# Patient Record
Sex: Female | Born: 1955 | Race: White | Hispanic: No | Marital: Married | Smoking: Never smoker
Health system: Southern US, Community
[De-identification: ages and names within clinical notes are randomized; demographics above are authoritative.]

## PROBLEM LIST (undated history)

## (undated) DIAGNOSIS — Z923 Personal history of irradiation: Secondary | ICD-10-CM

## (undated) DIAGNOSIS — C50919 Malignant neoplasm of unspecified site of unspecified female breast: Secondary | ICD-10-CM

## (undated) DIAGNOSIS — I10 Essential (primary) hypertension: Secondary | ICD-10-CM

## (undated) HISTORY — DX: Malignant neoplasm of unspecified site of unspecified female breast: C50.919

## (undated) HISTORY — DX: Essential (primary) hypertension: I10

---

## 1997-12-15 ENCOUNTER — Other Ambulatory Visit: Admission: RE | Admit: 1997-12-15 | Discharge: 1997-12-15 | Payer: Self-pay | Admitting: Gynecology

## 1998-01-06 ENCOUNTER — Other Ambulatory Visit: Admission: RE | Admit: 1998-01-06 | Discharge: 1998-01-06 | Payer: Self-pay | Admitting: Gynecology

## 1998-12-14 ENCOUNTER — Other Ambulatory Visit: Admission: RE | Admit: 1998-12-14 | Discharge: 1998-12-14 | Payer: Self-pay | Admitting: Gynecology

## 2000-01-18 ENCOUNTER — Other Ambulatory Visit: Admission: RE | Admit: 2000-01-18 | Discharge: 2000-01-18 | Payer: Self-pay | Admitting: Gynecology

## 2001-04-23 ENCOUNTER — Other Ambulatory Visit: Admission: RE | Admit: 2001-04-23 | Discharge: 2001-04-23 | Payer: Self-pay | Admitting: Gynecology

## 2002-05-31 ENCOUNTER — Encounter: Admission: RE | Admit: 2002-05-31 | Discharge: 2002-05-31 | Payer: Self-pay | Admitting: Gynecology

## 2002-05-31 ENCOUNTER — Encounter: Payer: Self-pay | Admitting: Gynecology

## 2002-08-05 ENCOUNTER — Other Ambulatory Visit: Admission: RE | Admit: 2002-08-05 | Discharge: 2002-08-05 | Payer: Self-pay | Admitting: Gynecology

## 2003-08-18 ENCOUNTER — Other Ambulatory Visit: Admission: RE | Admit: 2003-08-18 | Discharge: 2003-08-18 | Payer: Self-pay | Admitting: Gynecology

## 2003-09-01 ENCOUNTER — Encounter: Admission: RE | Admit: 2003-09-01 | Discharge: 2003-09-01 | Payer: Self-pay | Admitting: Gynecology

## 2004-01-05 ENCOUNTER — Ambulatory Visit (HOSPITAL_COMMUNITY): Admission: RE | Admit: 2004-01-05 | Discharge: 2004-01-05 | Payer: Self-pay | Admitting: Family Medicine

## 2004-08-30 ENCOUNTER — Other Ambulatory Visit: Admission: RE | Admit: 2004-08-30 | Discharge: 2004-08-30 | Payer: Self-pay | Admitting: Gynecology

## 2004-09-27 ENCOUNTER — Encounter: Admission: RE | Admit: 2004-09-27 | Discharge: 2004-09-27 | Payer: Self-pay | Admitting: Gynecology

## 2005-09-28 ENCOUNTER — Encounter: Admission: RE | Admit: 2005-09-28 | Discharge: 2005-09-28 | Payer: Self-pay | Admitting: Gynecology

## 2005-10-03 ENCOUNTER — Other Ambulatory Visit: Admission: RE | Admit: 2005-10-03 | Discharge: 2005-10-03 | Payer: Self-pay | Admitting: Gynecology

## 2006-12-07 ENCOUNTER — Encounter: Admission: RE | Admit: 2006-12-07 | Discharge: 2006-12-07 | Payer: Self-pay | Admitting: Gynecology

## 2006-12-12 ENCOUNTER — Other Ambulatory Visit: Admission: RE | Admit: 2006-12-12 | Discharge: 2006-12-12 | Payer: Self-pay | Admitting: Gynecology

## 2008-01-08 ENCOUNTER — Encounter: Admission: RE | Admit: 2008-01-08 | Discharge: 2008-01-08 | Payer: Self-pay | Admitting: Gynecology

## 2009-01-08 ENCOUNTER — Encounter: Admission: RE | Admit: 2009-01-08 | Discharge: 2009-01-08 | Payer: Self-pay | Admitting: Gynecology

## 2010-04-19 ENCOUNTER — Encounter: Admission: RE | Admit: 2010-04-19 | Discharge: 2010-04-19 | Payer: Self-pay | Admitting: Gynecology

## 2010-04-26 ENCOUNTER — Encounter: Admission: RE | Admit: 2010-04-26 | Discharge: 2010-04-26 | Payer: Self-pay | Admitting: Gynecology

## 2010-04-29 ENCOUNTER — Encounter: Admission: RE | Admit: 2010-04-29 | Discharge: 2010-04-29 | Payer: Self-pay | Admitting: Gynecology

## 2010-05-04 ENCOUNTER — Encounter: Admission: RE | Admit: 2010-05-04 | Discharge: 2010-05-04 | Payer: Self-pay | Admitting: Gynecology

## 2010-05-26 ENCOUNTER — Encounter
Admission: RE | Admit: 2010-05-26 | Discharge: 2010-05-26 | Payer: Self-pay | Source: Home / Self Care | Admitting: Surgery

## 2010-05-31 ENCOUNTER — Encounter
Admission: RE | Admit: 2010-05-31 | Discharge: 2010-05-31 | Payer: Self-pay | Source: Home / Self Care | Attending: Surgery | Admitting: Surgery

## 2010-05-31 ENCOUNTER — Ambulatory Visit
Admission: RE | Admit: 2010-05-31 | Discharge: 2010-05-31 | Payer: Self-pay | Source: Home / Self Care | Attending: Surgery | Admitting: Surgery

## 2010-05-31 HISTORY — PX: BREAST LUMPECTOMY: SHX2

## 2010-05-31 HISTORY — PX: BREAST EXCISIONAL BIOPSY: SUR124

## 2010-06-02 ENCOUNTER — Ambulatory Visit: Payer: Self-pay | Admitting: Oncology

## 2010-06-22 ENCOUNTER — Ambulatory Visit
Admission: RE | Admit: 2010-06-22 | Discharge: 2010-07-20 | Payer: Self-pay | Source: Home / Self Care | Attending: Radiation Oncology | Admitting: Radiation Oncology

## 2010-06-27 LAB — PREGNANCY, URINE: Preg Test, Ur: NEGATIVE

## 2010-07-21 ENCOUNTER — Ambulatory Visit: Payer: BC Managed Care – PPO | Attending: Radiation Oncology | Admitting: Radiation Oncology

## 2010-07-21 DIAGNOSIS — Z510 Encounter for antineoplastic radiation therapy: Secondary | ICD-10-CM | POA: Insufficient documentation

## 2010-07-21 DIAGNOSIS — D0590 Unspecified type of carcinoma in situ of unspecified breast: Secondary | ICD-10-CM | POA: Insufficient documentation

## 2010-07-21 DIAGNOSIS — C50119 Malignant neoplasm of central portion of unspecified female breast: Secondary | ICD-10-CM | POA: Insufficient documentation

## 2010-07-21 DIAGNOSIS — L538 Other specified erythematous conditions: Secondary | ICD-10-CM | POA: Insufficient documentation

## 2010-08-09 ENCOUNTER — Encounter (HOSPITAL_BASED_OUTPATIENT_CLINIC_OR_DEPARTMENT_OTHER): Payer: BC Managed Care – PPO | Admitting: Oncology

## 2010-08-09 ENCOUNTER — Other Ambulatory Visit: Payer: Self-pay | Admitting: Oncology

## 2010-08-09 DIAGNOSIS — D0590 Unspecified type of carcinoma in situ of unspecified breast: Secondary | ICD-10-CM

## 2010-08-09 LAB — COMPREHENSIVE METABOLIC PANEL
ALT: 14 U/L (ref 0–35)
AST: 18 U/L (ref 0–37)
Alkaline Phosphatase: 65 U/L (ref 39–117)
Calcium: 9 mg/dL (ref 8.4–10.5)
Chloride: 104 mEq/L (ref 96–112)
Creatinine, Ser: 0.9 mg/dL (ref 0.40–1.20)
Potassium: 3.6 mEq/L (ref 3.5–5.3)

## 2010-08-09 LAB — CBC WITH DIFFERENTIAL/PLATELET
BASO%: 0.2 % (ref 0.0–2.0)
EOS%: 0.7 % (ref 0.0–7.0)
MCH: 31.8 pg (ref 25.1–34.0)
MCHC: 34.5 g/dL (ref 31.5–36.0)
MONO%: 9.3 % (ref 0.0–14.0)
NEUT%: 80.7 % — ABNORMAL HIGH (ref 38.4–76.8)
RDW: 12.9 % (ref 11.2–14.5)
lymph#: 0.8 10*3/uL — ABNORMAL LOW (ref 0.9–3.3)

## 2010-08-18 ENCOUNTER — Encounter (HOSPITAL_BASED_OUTPATIENT_CLINIC_OR_DEPARTMENT_OTHER): Payer: BC Managed Care – PPO | Admitting: Oncology

## 2010-08-18 DIAGNOSIS — D0590 Unspecified type of carcinoma in situ of unspecified breast: Secondary | ICD-10-CM

## 2010-08-18 DIAGNOSIS — C50119 Malignant neoplasm of central portion of unspecified female breast: Secondary | ICD-10-CM

## 2010-08-31 LAB — DIFFERENTIAL
Basophils Relative: 0 % (ref 0–1)
Eosinophils Absolute: 0.1 10*3/uL (ref 0.0–0.7)
Eosinophils Relative: 1 % (ref 0–5)
Lymphs Abs: 2.3 10*3/uL (ref 0.7–4.0)
Neutrophils Relative %: 64 % (ref 43–77)

## 2010-08-31 LAB — COMPREHENSIVE METABOLIC PANEL
ALT: 20 U/L (ref 0–35)
AST: 19 U/L (ref 0–37)
Alkaline Phosphatase: 62 U/L (ref 39–117)
CO2: 27 mEq/L (ref 19–32)
Chloride: 107 mEq/L (ref 96–112)
GFR calc Af Amer: >60 mL/min (ref >60)
GFR calc non Af Amer: >60 mL/min (ref >60)
Glucose, Bld: 96 mg/dL (ref 70–99)
Potassium: 4.3 mEq/L (ref 3.5–5.1)
Sodium: 139 mEq/L (ref 135–145)
Total Bilirubin: 0.6 mg/dL (ref 0.3–1.2)

## 2010-08-31 LAB — URINALYSIS, ROUTINE W REFLEX MICROSCOPIC
Bilirubin Urine: NEGATIVE
Hgb urine dipstick: NEGATIVE
Ketones, ur: NEGATIVE mg/dL
Nitrite: NEGATIVE
pH: 6 (ref 5.0–8.0)

## 2010-08-31 LAB — URINE MICROSCOPIC-ADD ON

## 2010-08-31 LAB — CBC
HCT: 39.2 % (ref 36.0–46.0)
Hemoglobin: 13.6 g/dL (ref 12.0–15.0)
MCHC: 34.7 g/dL (ref 30.0–36.0)
RBC: 4.3 MIL/uL (ref 3.87–5.11)
WBC: 9.2 10*3/uL (ref 4.0–10.5)

## 2010-09-15 ENCOUNTER — Other Ambulatory Visit: Payer: Self-pay | Admitting: Oncology

## 2010-09-15 ENCOUNTER — Encounter (HOSPITAL_BASED_OUTPATIENT_CLINIC_OR_DEPARTMENT_OTHER): Payer: BC Managed Care – PPO | Admitting: Oncology

## 2010-09-15 DIAGNOSIS — C50119 Malignant neoplasm of central portion of unspecified female breast: Secondary | ICD-10-CM

## 2010-09-15 DIAGNOSIS — D0590 Unspecified type of carcinoma in situ of unspecified breast: Secondary | ICD-10-CM

## 2010-09-15 LAB — CBC WITH DIFFERENTIAL/PLATELET
BASO%: 0.3 % (ref 0.0–2.0)
Eosinophils Absolute: 0 10*3/uL (ref 0.0–0.5)
LYMPH%: 15.7 % (ref 14.0–49.7)
MCHC: 34.2 g/dL (ref 31.5–36.0)
MONO#: 0.8 10*3/uL (ref 0.1–0.9)
MONO%: 11.8 % (ref 0.0–14.0)
NEUT#: 5.1 10*3/uL (ref 1.5–6.5)
Platelets: 209 10*3/uL (ref 145–400)
RBC: 4.06 10*6/uL (ref 3.70–5.45)
RDW: 12.3 % (ref 11.2–14.5)
WBC: 7.1 10*3/uL (ref 3.9–10.3)

## 2010-09-15 LAB — COMPREHENSIVE METABOLIC PANEL
ALT: 18 U/L (ref 0–35)
Albumin: 3.7 g/dL (ref 3.5–5.2)
Alkaline Phosphatase: 64 U/L (ref 39–117)
Glucose, Bld: 101 mg/dL — ABNORMAL HIGH (ref 70–99)
Potassium: 4 mEq/L (ref 3.5–5.3)
Sodium: 139 mEq/L (ref 135–145)
Total Bilirubin: 0.7 mg/dL (ref 0.3–1.2)
Total Protein: 6.5 g/dL (ref 6.0–8.3)

## 2010-09-16 ENCOUNTER — Ambulatory Visit: Payer: BC Managed Care – PPO | Attending: Radiation Oncology | Admitting: Radiation Oncology

## 2010-11-22 ENCOUNTER — Encounter (INDEPENDENT_AMBULATORY_CARE_PROVIDER_SITE_OTHER): Payer: Self-pay | Admitting: Surgery

## 2010-12-15 ENCOUNTER — Other Ambulatory Visit: Payer: Self-pay | Admitting: Oncology

## 2010-12-15 ENCOUNTER — Encounter (HOSPITAL_BASED_OUTPATIENT_CLINIC_OR_DEPARTMENT_OTHER): Payer: BC Managed Care – PPO | Admitting: Oncology

## 2010-12-15 DIAGNOSIS — D0590 Unspecified type of carcinoma in situ of unspecified breast: Secondary | ICD-10-CM

## 2010-12-15 DIAGNOSIS — C50119 Malignant neoplasm of central portion of unspecified female breast: Secondary | ICD-10-CM

## 2010-12-15 LAB — CBC WITH DIFFERENTIAL/PLATELET
BASO%: 0.7 % (ref 0.0–2.0)
Basophils Absolute: 0 10*3/uL (ref 0.0–0.1)
EOS%: 2.1 % (ref 0.0–7.0)
Eosinophils Absolute: 0.1 10*3/uL (ref 0.0–0.5)
HCT: 38.5 % (ref 34.8–46.6)
HGB: 13.3 g/dL (ref 11.6–15.9)
LYMPH%: 21.2 % (ref 14.0–49.7)
MCH: 32.2 pg (ref 25.1–34.0)
MCHC: 34.4 g/dL (ref 31.5–36.0)
MCV: 93.5 fL (ref 79.5–101.0)
MONO#: 0.5 10*3/uL (ref 0.1–0.9)
MONO%: 10.5 % (ref 0.0–14.0)
NEUT#: 3.2 10*3/uL (ref 1.5–6.5)
NEUT%: 65.5 % (ref 38.4–76.8)
Platelets: 200 10*3/uL (ref 145–400)
RBC: 4.11 10*6/uL (ref 3.70–5.45)
RDW: 12.8 % (ref 11.2–14.5)
WBC: 4.9 10*3/uL (ref 3.9–10.3)
lymph#: 1 10*3/uL (ref 0.9–3.3)

## 2010-12-15 LAB — COMPREHENSIVE METABOLIC PANEL
Albumin: 3.9 g/dL (ref 3.5–5.2)
CO2: 26 mEq/L (ref 19–32)
Glucose, Bld: 92 mg/dL (ref 70–99)
Sodium: 141 mEq/L (ref 135–145)
Total Bilirubin: 0.4 mg/dL (ref 0.3–1.2)
Total Protein: 6.5 g/dL (ref 6.0–8.3)

## 2010-12-15 LAB — VITAMIN D 25 HYDROXY (VIT D DEFICIENCY, FRACTURES): Vit D, 25-Hydroxy: 33 ng/mL (ref 30–89)

## 2011-02-18 ENCOUNTER — Encounter (INDEPENDENT_AMBULATORY_CARE_PROVIDER_SITE_OTHER): Payer: Self-pay | Admitting: General Surgery

## 2011-02-18 DIAGNOSIS — C50212 Malignant neoplasm of upper-inner quadrant of left female breast: Secondary | ICD-10-CM | POA: Insufficient documentation

## 2011-02-23 ENCOUNTER — Encounter (INDEPENDENT_AMBULATORY_CARE_PROVIDER_SITE_OTHER): Payer: Self-pay | Admitting: Surgery

## 2011-02-23 ENCOUNTER — Ambulatory Visit (INDEPENDENT_AMBULATORY_CARE_PROVIDER_SITE_OTHER): Payer: BC Managed Care – PPO | Admitting: Surgery

## 2011-02-23 VITALS — BP 118/84 | HR 62

## 2011-02-23 DIAGNOSIS — Z853 Personal history of malignant neoplasm of breast: Secondary | ICD-10-CM

## 2011-02-23 NOTE — Progress Notes (Signed)
NAME: MAKAELAH CRANFIELD [place]       DOB: 1955-10-03           DATE: 02/23/2011       MRN: [id number]   Sara [name] is a 55 y.o.Marland Kitchenfemale who presents for routine followup of her Left breas cancerdiagnosed in 2011 and treated with lumpectomy, radiation and anti-estrogen. She has no problems or concerns on either side.  PFSH She has had no significant changes since the last visit here.  ROS There have been no significant changes since the last visit here  General: The patient is alert, oriented, generally healty appearing, NAD. Mood and affect are normal.  Breasts: Right breast is normal. The left breast has some post radiation changes. The lumpectomy site is soft and there is no evidence of a recurrent cancer Lymphatics: She has no axillary or supraclavicular adenopathy on either side.  Extremities: Full ROM of the surgical side with no lymphedema noted.  Data Reviewed: Notes from Dr Welton Flakes  Impression: Doing well, with no evidence of recurrent cancer or new cancer  Plan: Will continue to follow up on an annual basis here.

## 2011-04-11 ENCOUNTER — Other Ambulatory Visit: Payer: Self-pay | Admitting: Gynecology

## 2011-04-11 DIAGNOSIS — C50919 Malignant neoplasm of unspecified site of unspecified female breast: Secondary | ICD-10-CM

## 2011-04-11 DIAGNOSIS — Z9889 Other specified postprocedural states: Secondary | ICD-10-CM

## 2011-04-13 ENCOUNTER — Telehealth: Payer: Self-pay | Admitting: Oncology

## 2011-04-13 NOTE — Telephone Encounter (Signed)
Lvm advising next appt 07/18/11 @ 3pm.

## 2011-05-02 ENCOUNTER — Other Ambulatory Visit: Payer: Self-pay | Admitting: Gynecology

## 2011-05-06 ENCOUNTER — Ambulatory Visit
Admission: RE | Admit: 2011-05-06 | Discharge: 2011-05-06 | Disposition: A | Discharge status: Home / Self Care | Payer: BC Managed Care – PPO | Source: Ambulatory Visit | Attending: Gynecology | Admitting: Gynecology

## 2011-05-06 DIAGNOSIS — C50919 Malignant neoplasm of unspecified site of unspecified female breast: Secondary | ICD-10-CM

## 2011-05-06 DIAGNOSIS — Z9889 Other specified postprocedural states: Secondary | ICD-10-CM

## 2011-07-18 ENCOUNTER — Other Ambulatory Visit: Payer: BC Managed Care – PPO | Admitting: Lab

## 2011-07-18 ENCOUNTER — Ambulatory Visit: Payer: BC Managed Care – PPO | Admitting: Oncology

## 2011-07-20 ENCOUNTER — Other Ambulatory Visit: Payer: BC Managed Care – PPO | Admitting: Lab

## 2011-07-20 ENCOUNTER — Ambulatory Visit: Payer: BC Managed Care – PPO | Admitting: Family

## 2011-07-21 ENCOUNTER — Ambulatory Visit: Payer: BC Managed Care – PPO | Admitting: Family

## 2011-08-02 ENCOUNTER — Other Ambulatory Visit: Payer: Self-pay

## 2011-08-05 ENCOUNTER — Telehealth: Payer: Self-pay | Admitting: Oncology

## 2011-08-05 ENCOUNTER — Ambulatory Visit (HOSPITAL_BASED_OUTPATIENT_CLINIC_OR_DEPARTMENT_OTHER): Payer: BC Managed Care – PPO | Admitting: Family

## 2011-08-05 VITALS — BP 121/79 | HR 62 | Temp 97.6°F | Wt 168.0 lb

## 2011-08-05 DIAGNOSIS — Z170 Estrogen receptor positive status [ER+]: Secondary | ICD-10-CM

## 2011-08-05 DIAGNOSIS — Z79810 Long term (current) use of selective estrogen receptor modulators (SERMs): Secondary | ICD-10-CM

## 2011-08-05 DIAGNOSIS — Z853 Personal history of malignant neoplasm of breast: Secondary | ICD-10-CM

## 2011-08-05 DIAGNOSIS — C50919 Malignant neoplasm of unspecified site of unspecified female breast: Secondary | ICD-10-CM

## 2011-08-05 NOTE — Telephone Encounter (Signed)
gve the pt her aug 2013 appt calendar 

## 2011-08-08 ENCOUNTER — Encounter: Payer: Self-pay | Admitting: Family

## 2011-08-08 NOTE — Progress Notes (Signed)
[facility]  Name: Sara [name]                  DATE: 08/08/2011 MRN: [id number]                      DOB: 04/22/1956  REFERRING PHYSICIAN: Gretta Cool, MD  DIAGNOSIS: Patient Active Problem List  Diagnoses Date Noted  . 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  . 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 2011. 1.2 cm, Grade 2, ER/PR +, HER 2 +. Enrolled in B43 study, randomized to radiation only.    CURRENT THERAPY: Tamoxifen, began March 2012.   INTERIM HISTORY: Has done well since last visit 12/15/2010. Remains on tamoxifen and with chief complaint of hot flashes. No vaginal dryness or vaginal discharge. Has occasional mood swings, her brother died last year and this has caused depression. Recently started on Citalopram by primary care, Dr. Cliffton Asters.  Recent uterine biopsy showed uterine polyps, this done by Dr. Nicholas Lose. Last mammogram 05/06/2011, no evidence of disease.  Bowel and bladder function are normal, appetite is good. No headache or blurred vision. No cough or shortness of breath, no abdominal pain. No bone pain.  PHYSICAL EXAM: BP 121/79  Pulse 62  Temp(Src) 97.6 F (36.4 C) (Oral)  Wt 168 lb (76.204 kg) General: Well developed, well nourished, in no acute distress.  EENT: No ocular or oral lesions. No stomatitis.  Respiratory: Lungs are clear to auscultation bilaterally with normal respiratory movement and no accessory muscle use. Cardiac: No murmur, rub or tachycardia. No upper or lower extremity edema.  GI: Abdomen is soft, no palpable hepatosplenomegaly. No fluid wave. No tenderness. Musculoskeletal: No kyphosis, no tenderness over the spine, ribs or hips. Lymph: No cervical, infraclavicular, axillary or inguinal adenopathy. Neuro: No focal neurological deficits. Psych: Alert and oriented X 3,  appropriate mood and affect.  BREAST EXAM: In the supine position, with the right arm over the head, the right nipple is everted. No periareolar edema or nipple discharge. No mass in any quadrant or subareolar region. No redness of the skin. No right axillary adenopathy. With the left arm over the head, the left nipple Is everted. No periareolar edema or nipple discharge. No mass in any quadrant or subareolar region.In the 9 o'clock position, a well-healed remote lumpectomy incision. No nodularity surrounding the incision. No redness of the skin. No left axillary adenopathy.     LABORATORY STUDIES:  None drawn today.    IMPRESSION:  56 year old female with: 1. 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 On tamoxifen with no evidence of disease. 2. Depression, new prescription for Citalopram per Dr. Cliffton Asters.    PLAN:   1. Following NCCN guidelines we will see her every 6 months to complete 5 years. 2. Mammogram November 2013.  DISCUSSION: I gave her a handout with information regarding the use of antidepressants while on tamoxifen. Citalopram has a mild affect on CYP2D6. She will followup with primary care in approximately 3 weeks and share this information with him. A better choice for her while on tamoxifen could be Effexor which has minimal affect on CYP2D6. It is the safest choice when taking tamoxifen. I will leave this at his discretion to make the change if he deems appropriate.

## 2011-12-06 ENCOUNTER — Other Ambulatory Visit: Payer: Self-pay | Admitting: Oncology

## 2012-02-02 ENCOUNTER — Telehealth: Payer: Self-pay | Admitting: *Deleted

## 2012-02-02 ENCOUNTER — Ambulatory Visit (HOSPITAL_BASED_OUTPATIENT_CLINIC_OR_DEPARTMENT_OTHER): Payer: BC Managed Care – PPO | Admitting: Oncology

## 2012-02-02 ENCOUNTER — Other Ambulatory Visit (HOSPITAL_BASED_OUTPATIENT_CLINIC_OR_DEPARTMENT_OTHER): Payer: BC Managed Care – PPO | Admitting: Lab

## 2012-02-02 ENCOUNTER — Encounter: Payer: Self-pay | Admitting: Oncology

## 2012-02-02 VITALS — BP 124/76 | HR 73 | Temp 98.3°F | Resp 20 | Ht 64.5 in | Wt 175.6 lb

## 2012-02-02 DIAGNOSIS — D0590 Unspecified type of carcinoma in situ of unspecified breast: Secondary | ICD-10-CM

## 2012-02-02 DIAGNOSIS — Z170 Estrogen receptor positive status [ER+]: Secondary | ICD-10-CM

## 2012-02-02 DIAGNOSIS — Z853 Personal history of malignant neoplasm of breast: Secondary | ICD-10-CM

## 2012-02-02 LAB — COMPREHENSIVE METABOLIC PANEL
AST: 20 U/L (ref 0–37)
BUN: 16 mg/dL (ref 6–23)
Calcium: 8.6 mg/dL (ref 8.4–10.5)
Chloride: 109 mEq/L (ref 96–112)
Creatinine, Ser: 0.96 mg/dL (ref 0.50–1.10)
Total Bilirubin: 0.3 mg/dL (ref 0.3–1.2)

## 2012-02-02 MED ORDER — VENLAFAXINE HCL ER 37.5 MG PO CP24: 37.5000 mg | ORAL_CAPSULE | Freq: Every day | ORAL | Status: DC

## 2012-02-02 MED ORDER — TAMOXIFEN CITRATE 20 MG PO TABS: 20.0000 mg | ORAL_TABLET | Freq: Every day | ORAL | Status: AC

## 2012-02-02 NOTE — Progress Notes (Signed)
OFFICE PROGRESS NOTE  CC  Sara Cool, MD [address] [place] [address] Dr. Lurline Hare Dr. 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 Tumor was estrogen receptor +72% progesterone receptor +25%.  #2 patient went on to have a lumpectomy of the left breast on 05/31/2010. 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  #3 Postoperatively Patient was seen in medical oncology she was enrolled on NSABP B. 43 clinical trial. Her tumor was tested for the HER-2/neu receptor and she was positive. She was randomized to observation only.  #4 patient underwent radiation therapy to the left breast which she completed from 07/07/2010 to August 18 2010.  #5 after completion of radiation patient was begun on adjuvant tamoxifen 20 mg daily Starting March 2012.  CURRENT THERAPY:Tamoxifen 20 mg daily since March 2012.  INTERVAL HISTORY: Sara [name] 56 y.o. female returns for Followup visit today at 6 months. Overall she is tolerating the tamoxifen quite nicely however she does experience significant hot flashes day and night. She does tell me that they are worsening in intensity especially during the daytime. She did have some what sounds like Vaginal spotting and she was seen by Dr. Eugenia Mcalpine a complete workup. Today she is otherwise doing well she denies any visual disturbances shortness of breath chest pains palpitations no dominant no pain no calf tenderness or swelling no peripheral paresthesias no vaginal bleeding or spotting. Remainder of the 10 point review of systems is negative.  MEDICAL HISTORY: Past  Medical History  Diagnosis Date  . Hypertension   . Breast cancer     ALLERGIES:   has no known allergies.  MEDICATIONS:  Current Outpatient Prescriptions  Medication Sig Dispense Refill  . ALPRAZolam (XANAX) 0.25 MG tablet Take 0.25 mg by mouth at bedtime as needed.        Marland Kitchen aspirin 81 MG tablet Take 81 mg by mouth daily.        Marland Kitchen CALCIUM PO Take by mouth.        . Cholecalciferol (VITAMIN D PO) Take by mouth.        . DIOVAN 160 MG tablet       . Multiple Vitamin (MULTIVITAMIN) capsule Take 1 capsule by mouth daily.        . polyethylene glycol powder (GLYCOLAX/MIRALAX) powder       . tamoxifen (NOLVADEX) 20 MG tablet TAKE 1 TABLET BY MOUTH EVERY DAY  90 tablet  4    SURGICAL HISTORY:  Past Surgical History  Procedure Date  . Breast lumpectomy 05/31/2010    Left Dr Jamey Ripa  . Breast excisional biopsy 05/31/2010    Right     REVIEW OF SYSTEMS:  Pertinent items are noted in HPI.   PHYSICAL EXAMINATION: General appearance: alert, cooperative and appears stated age Neck: no adenopathy, no carotid bruit, no JVD, supple, symmetrical, trachea midline and thyroid not enlarged, symmetric, no tenderness/mass/nodules Lymph nodes: Cervical, supraclavicular, and axillary nodes normal. Resp: clear to auscultation bilaterally Back: symmetric, no curvature. ROM normal. No CVA tenderness. Cardio: regular rate and rhythm, S1, S2 normal, no murmur, click, rub or gallop GI: soft, non-tender; bowel sounds normal;  no masses,  no organomegaly Extremities: extremities normal, atraumatic, no cyanosis or edema Neurologic: Grossly normal Bilateral breast examination right breast reveals well-healed incisional scar no nipple retraction discharge or inversion no masses. Right breast no masses or nipple discharge.  ECOG PERFORMANCE STATUS: 0 - Asymptomatic  Blood pressure 124/76, pulse 73, temperature 98.3 F (36.8 C), temperature source Oral, resp. rate 20, height 5' 4.5" (1.638 m), weight 175 lb  9.6 oz (79.652 kg).  LABORATORY DATA: Lab Results  Component Value Date   WBC 4.9 12/15/2010   HGB 13.3 12/15/2010   HCT 38.5 12/15/2010   MCV 93.5 12/15/2010   PLT 200 12/15/2010      Chemistry      Component Value Date/Time   NA 141 12/15/2010 0921   NA 141 12/15/2010 0921   NA 141 12/15/2010 0921   K 4.5 12/15/2010 0921   K 4.5 12/15/2010 0921   K 4.5 12/15/2010 0921   CL 106 12/15/2010 0921   CL 106 12/15/2010 0921   CL 106 12/15/2010 0921   CO2 26 12/15/2010 0921   CO2 26 12/15/2010 0921   CO2 26 12/15/2010 0921   BUN 16 12/15/2010 0921   BUN 16 12/15/2010 0921   BUN 16 12/15/2010 0921   CREATININE 0.92 12/15/2010 0921   CREATININE 0.92 12/15/2010 0921   CREATININE 0.92 12/15/2010 0921      Component Value Date/Time   CALCIUM 9.1 12/15/2010 0921   CALCIUM 9.1 12/15/2010 0921   CALCIUM 9.1 12/15/2010 0921   ALKPHOS 53 12/15/2010 0921   ALKPHOS 53 12/15/2010 0921   ALKPHOS 53 12/15/2010 0921   AST 16 12/15/2010 0921   AST 16 12/15/2010 0921   AST 16 12/15/2010 0921   ALT 16 12/15/2010 0921   ALT 16 12/15/2010 0921   ALT 16 12/15/2010 0921   BILITOT 0.4 12/15/2010 0921   BILITOT 0.4 12/15/2010 0921   BILITOT 0.4 12/15/2010 0921       RADIOGRAPHIC STUDIES:  No results found.  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 Patient underwent a lumpectomy in December 2011. Patient was enrolled on NSABP B. 43 study and was randomized to observation/radiation therapy arm. She has been on tamoxifen after completing radiation. Overall she is tolerating it well.  #2 hot flashes secondary to tamoxifen we discussed the role of SSRIs such as Effexor we discussed the side effects and benefits of this. I do think a trial of SSRIs is appropriate in her.   PLAN:   #1 overall patient is doing well from DCIS perspective she has no evidence of recurrent disease.  #2 I will  plan on starting her on Effexor 37.5 mg on a daily basis a prescription was sent to her pharmacy. She will also try peridin C to see if that helps with her hot flashes.  #3 I will plan on seeing her back in 3 month's time in followup   All questions were answered. The patient knows to call the clinic with any problems, questions or concerns. We can certainly see the patient much sooner if necessary.  I spent 25 minutes counseling the patient face to face. The total time spent in the appointment was 30 minutes.    Drue Second, MD Medical/Oncology [facility] [phone] (beeper) [phone] (Office)  02/02/2012, 11:41 AM

## 2012-02-02 NOTE — Telephone Encounter (Signed)
Made patient appointment for three month and six month appointment

## 2012-02-02 NOTE — Patient Instructions (Addendum)
Effexor XR  1 daily  peridin C daily  I will see you back in 3 months

## 2012-02-29 ENCOUNTER — Ambulatory Visit (INDEPENDENT_AMBULATORY_CARE_PROVIDER_SITE_OTHER): Payer: BC Managed Care – PPO | Admitting: Surgery

## 2012-02-29 ENCOUNTER — Encounter (INDEPENDENT_AMBULATORY_CARE_PROVIDER_SITE_OTHER): Payer: Self-pay | Admitting: Surgery

## 2012-02-29 VITALS — BP 108/78 | HR 72 | Resp 16 | Ht 64.5 in | Wt 173.0 lb

## 2012-02-29 DIAGNOSIS — Z853 Personal history of malignant neoplasm of breast: Secondary | ICD-10-CM

## 2012-02-29 NOTE — Patient Instructions (Signed)
Continue annual mammograms and follow-up 

## 2012-02-29 NOTE — Progress Notes (Signed)
NAME: Sara [name] [place]       DOB: 1956/04/10           DATE: 02/29/2012       MRN: [id number]   [name] is a 56 y.o.Marland Kitchenfemale who presents for routine followup of her Left breas cancerdiagnosed in 2011 and treated with lumpectomy, radiation and anti-estrogen. She has no problems or concerns on either side.Hot flashes from Tamoxifen  PFSH She has had no significant changes since the last visit here.  ROS There have been no significant changes since the last visit here  General: The patient is alert, oriented, generally healty appearing, NAD. Mood and affect are normal.  Breasts: Right breast is normal. The left breast has minimla post radiation changes. The lumpectomy site is soft and there is no evidence of a recurrent cancer Lymphatics: She has no axillary or supraclavicular adenopathy on either side.  Extremities: Full ROM of the surgical side with no lymphedema noted.  Data Reviewed: Notes from Dr Welton Flakes  Impression: Doing well, with no evidence of recurrent cancer or new cancer  Plan: Will continue to follow up on an annual basis here.Mammogram due in November

## 2012-05-03 ENCOUNTER — Other Ambulatory Visit: Payer: Self-pay | Admitting: Gynecology

## 2012-05-03 DIAGNOSIS — Z853 Personal history of malignant neoplasm of breast: Secondary | ICD-10-CM

## 2012-05-03 DIAGNOSIS — Z9889 Other specified postprocedural states: Secondary | ICD-10-CM

## 2012-05-04 ENCOUNTER — Other Ambulatory Visit: Payer: Self-pay | Admitting: Emergency Medicine

## 2012-05-04 DIAGNOSIS — Z853 Personal history of malignant neoplasm of breast: Secondary | ICD-10-CM

## 2012-05-07 ENCOUNTER — Encounter: Payer: Self-pay | Admitting: Adult Health

## 2012-05-07 ENCOUNTER — Ambulatory Visit (HOSPITAL_BASED_OUTPATIENT_CLINIC_OR_DEPARTMENT_OTHER): Payer: BC Managed Care – PPO | Admitting: Adult Health

## 2012-05-07 ENCOUNTER — Telehealth: Payer: Self-pay | Admitting: Oncology

## 2012-05-07 ENCOUNTER — Other Ambulatory Visit (HOSPITAL_BASED_OUTPATIENT_CLINIC_OR_DEPARTMENT_OTHER): Payer: BC Managed Care – PPO | Admitting: Lab

## 2012-05-07 VITALS — BP 122/81 | HR 68 | Temp 97.8°F | Resp 20 | Ht 64.5 in | Wt 182.2 lb

## 2012-05-07 DIAGNOSIS — Z853 Personal history of malignant neoplasm of breast: Secondary | ICD-10-CM

## 2012-05-07 DIAGNOSIS — D0590 Unspecified type of carcinoma in situ of unspecified breast: Secondary | ICD-10-CM

## 2012-05-07 DIAGNOSIS — Z170 Estrogen receptor positive status [ER+]: Secondary | ICD-10-CM

## 2012-05-07 DIAGNOSIS — N951 Menopausal and female climacteric states: Secondary | ICD-10-CM

## 2012-05-07 DIAGNOSIS — R232 Flushing: Secondary | ICD-10-CM

## 2012-05-07 DIAGNOSIS — F419 Anxiety disorder, unspecified: Secondary | ICD-10-CM

## 2012-05-07 LAB — COMPREHENSIVE METABOLIC PANEL (CC13)
ALT: 23 U/L (ref 0–55)
Albumin: 3.4 g/dL — ABNORMAL LOW (ref 3.5–5.0)
Alkaline Phosphatase: 64 U/L (ref 40–150)
CO2: 30 mEq/L — ABNORMAL HIGH (ref 22–29)
Glucose: 85 mg/dl (ref 70–99)
Potassium: 4.6 mEq/L (ref 3.5–5.1)
Sodium: 139 mEq/L (ref 136–145)
Total Bilirubin: 0.33 mg/dL (ref 0.20–1.20)
Total Protein: 6.5 g/dL (ref 6.4–8.3)

## 2012-05-07 LAB — CBC WITH DIFFERENTIAL/PLATELET
Basophils Absolute: 0 10*3/uL (ref 0.0–0.1)
HCT: 35.4 % (ref 34.8–46.6)
HGB: 12.3 g/dL (ref 11.6–15.9)
MCH: 32.8 pg (ref 25.1–34.0)
MONO#: 0.5 10*3/uL (ref 0.1–0.9)
NEUT%: 64.9 % (ref 38.4–76.8)
lymph#: 1.6 10*3/uL (ref 0.9–3.3)

## 2012-05-07 MED ORDER — ALPRAZOLAM 0.25 MG PO TABS: 0.2500 mg | ORAL_TABLET | Freq: Three times a day (TID) | ORAL | Status: DC | PRN

## 2012-05-07 MED ORDER — VENLAFAXINE HCL ER 75 MG PO CP24: 75.0000 mg | ORAL_CAPSULE | Freq: Every day | ORAL | Status: DC

## 2012-05-07 NOTE — Progress Notes (Deleted)
OFFICE PROGRESS NOTE  CC  Gretta Cool, MD [address] [place] [address] Dr. Lurline Hare Dr. 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 Tumor was estrogen receptor +72% progesterone receptor +25%.  #2 patient went on to have a lumpectomy of the left breast on 05/31/2010. 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  #3 Postoperatively Patient was seen in medical oncology she was enrolled on NSABP B. 43 clinical trial. Her tumor was tested for the HER-2/neu receptor and she was positive. She was randomized to observation only.  #4 patient underwent radiation therapy to the left breast which she completed from 07/07/2010 to August 18 2010.  #5 after completion of radiation patient was begun on adjuvant tamoxifen 20 mg daily Starting March 2012.  CURRENT THERAPY:Tamoxifen 20 mg daily since March 2012.  INTERVAL HISTORY: COLENE MINES 56 y.o. female returns for Followup visit today at 6 months. Overall she is tolerating the tamoxifen quite nicely however she does experience significant hot flashes day and night. She does tell me that they are worsening in intensity especially during the daytime. She did have some what sounds like Vaginal spotting and she was seen by Dr. Eugenia Mcalpine a complete workup. Today she is otherwise doing well she denies any visual disturbances shortness of breath chest pains palpitations no dominant no pain no calf tenderness or swelling no peripheral paresthesias no vaginal bleeding or spotting. Remainder of the 10 point review of systems is negative.  MEDICAL HISTORY: Past  Medical History  Diagnosis Date  . Hypertension   . Breast cancer     ALLERGIES:   has no known allergies.  MEDICATIONS:  Current Outpatient Prescriptions  Medication Sig Dispense Refill  . ALPRAZolam (XANAX) 0.25 MG tablet Take 0.25 mg by mouth at bedtime as needed.        Marland Kitchen aspirin 81 MG tablet Take 81 mg by mouth daily.        Marland Kitchen Bioflavonoid Products (PERIDIN-C PO) Take 2 tablets by mouth 2 (two) times daily. Two tablets in the morning, one after lunch      . CALCIUM PO Take by mouth.        . Cholecalciferol (VITAMIN D PO) Take by mouth.        . DIOVAN 160 MG tablet       . Multiple Vitamin (MULTIVITAMIN) capsule Take 1 capsule by mouth daily.        . polyethylene glycol powder (GLYCOLAX/MIRALAX) powder       . tamoxifen (NOLVADEX) 20 MG tablet TAKE 1 TABLET BY MOUTH EVERY DAY  90 tablet  4  . venlafaxine XR (EFFEXOR-XR) 37.5 MG 24 hr capsule Take 1 capsule (37.5 mg total) by mouth daily.  30 capsule  6    SURGICAL HISTORY:  Past Surgical History  Procedure Date  . Breast lumpectomy 05/31/2010    Left Dr Jamey Ripa  . Breast excisional biopsy 05/31/2010    Right     REVIEW OF SYSTEMS:  Pertinent items are noted in HPI.   Health Maintenance  Mammogram: 05/05/2012, next appt 11/21 Colonoscopy: 11/2010 Bone Density Scan: 2012 Pap Smear: 05/24/12 (scheduled) Eye Exam:  2 years ago Vitamin D Level: 04/2011 Lipid Panel: 04/2011   PHYSICAL EXAMINATION: General appearance: alert, cooperative and appears stated age Neck: no adenopathy, no carotid bruit, no JVD, supple, symmetrical, trachea midline and thyroid not enlarged, symmetric, no tenderness/mass/nodules Lymph nodes: Cervical, supraclavicular, and axillary nodes normal. Resp: clear to auscultation bilaterally Back: symmetric, no curvature. ROM normal. No CVA tenderness. Cardio: regular rate and rhythm, S1, S2 normal, no murmur, click, rub or gallop GI: soft, non-tender; bowel sounds normal; no masses,  no  organomegaly Extremities: extremities normal, atraumatic, no cyanosis or edema Neurologic: Grossly normal Bilateral breast examination right breast reveals well-healed incisional scar no nipple retraction discharge or inversion no masses. Right breast no masses or nipple discharge.  ECOG PERFORMANCE STATUS: 0 - Asymptomatic  Blood pressure 122/81, pulse 68, temperature 97.8 F (36.6 C), temperature source Oral, resp. rate 20, height 5' 4.5" (1.638 m), weight 182 lb 3.2 oz (82.645 kg).  LABORATORY DATA: Lab Results  Component Value Date   WBC 6.1 05/07/2012   HGB 12.3 05/07/2012   HCT 35.4 05/07/2012   MCV 94.4 05/07/2012   PLT 216 05/07/2012      Chemistry      Component Value Date/Time   NA 144 02/02/2012 1104   K 4.1 02/02/2012 1104   CL 109 02/02/2012 1104   CO2 28 02/02/2012 1104   BUN 16 02/02/2012 1104   CREATININE 0.96 02/02/2012 1104      Component Value Date/Time   CALCIUM 8.6 02/02/2012 1104   ALKPHOS 59 02/02/2012 1104   AST 20 02/02/2012 1104   ALT 20 02/02/2012 1104   BILITOT 0.3 02/02/2012 1104       RADIOGRAPHIC STUDIES:  No results found.  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 Patient underwent a lumpectomy in December 2011. Patient was enrolled on NSABP B. 43 study and was randomized to observation/radiation therapy arm. She has been on tamoxifen after completing radiation. Overall she is tolerating it well.  #2 hot flashes secondary to tamoxifen we discussed the role of SSRIs such as Effexor we discussed the side effects and benefits of this. I do think a trial of SSRIs is appropriate in her.   PLAN:   #1 overall patient is doing well from DCIS perspective she has no evidence of recurrent disease.  #2 I will plan on starting her on Effexor 37.5 mg on a daily basis a prescription was sent to her pharmacy. She will also  try peridin C to see if that helps with her hot flashes.  #3 I will plan on seeing her back in 3 month's time in followup   All questions were answered. The patient knows to call the clinic with any problems, questions or concerns. We can certainly see the patient much sooner if necessary.  I spent 25 minutes counseling the patient face to face. The total time spent in the appointment was 30 minutes.    Drue Second, MD Medical/Oncology [facility] [phone] (beeper) [phone] (Office)  05/07/2012, 2:45 PM

## 2012-05-07 NOTE — Patient Instructions (Addendum)
Doing well.  No sign of recurrence.  Continue Tamoxifen.  I have increased your effexor dose.  We will f/u on your mammogram results this week.  Try to exercise 30 min/day most days of the week.  Please call us if you have any questions or concerns.

## 2012-05-07 NOTE — Progress Notes (Signed)
OFFICE PROGRESS NOTE  CC  Sara Cool, MD [address] [place] [address] Dr. Lurline Hare Dr. 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 Tumor was estrogen receptor +72% progesterone receptor +25%.  #2 patient went on to have a lumpectomy of the left breast on 05/31/2010. 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  #3 Postoperatively Patient was seen in medical oncology she was enrolled on NSABP B. 43 clinical trial. Her tumor was tested for the HER-2/neu receptor and she was positive. She was randomized to observation only.  #4 patient underwent radiation therapy to the left breast which she completed from 07/07/2010 to August 18 2010.  #5 after completion of radiation patient was begun on adjuvant tamoxifen 20 mg daily Starting March 2012.  CURRENT THERAPY:Tamoxifen 20 mg daily since March 2012.  INTERVAL HISTORY: Sara [name] 56 y.o. female returns for follow up today.  She is doing well.  She has been experiencing emotional lability and increased hot flashes.  Otherwise, she's feeling well.  Her mammogram is this week, and she's doing self breast exams.  She has gained weight, and had questions about what to do to lose weight.    MEDICAL HISTORY: Past Medical History  Diagnosis Date  . Hypertension   . Breast cancer     ALLERGIES:   has no known allergies.  MEDICATIONS:  Current Outpatient Prescriptions  Medication Sig Dispense Refill  . ALPRAZolam (XANAX) 0.25 MG tablet Take 0.25 mg by mouth at bedtime as needed.        Marland Kitchen aspirin 81 MG tablet Take 81 mg by mouth daily.        Marland Kitchen  CALCIUM PO Take by mouth.        . Cholecalciferol (VITAMIN D PO) Take by mouth.        . DIOVAN 160 MG tablet       . Multiple Vitamin (MULTIVITAMIN) capsule Take 1 capsule by mouth daily.        . polyethylene glycol powder (GLYCOLAX/MIRALAX) powder       . tamoxifen (NOLVADEX) 20 MG tablet TAKE 1 TABLET BY MOUTH EVERY DAY  90 tablet  4  . venlafaxine XR (EFFEXOR-XR) 37.5 MG 24 hr capsule Take 1 capsule (37.5 mg total) by mouth daily.  30 capsule  6    SURGICAL HISTORY:  Past Surgical History  Procedure Date  . Breast lumpectomy 05/31/2010    Left Dr Jamey Ripa  . Breast excisional biopsy 05/31/2010    Right     REVIEW OF SYSTEMS:  General: fatigue (-), night sweats (-), fever (-), pain (-) Lymph: palpable nodes (-) HEENT: vision changes (-), mucositis (-), gum bleeding (-), epistaxis (-) Cardiovascular: chest pain (-), palpitations (-) Pulmonary: shortness of breath (-), dyspnea on exertion (-), cough (-), hemoptysis (-) GI:  [name] satiety (-), melena (-), dysphagia (-), nausea/vomiting (-), diarrhea (-) GU: dysuria (-), hematuria (-), incontinence (-) Musculoskeletal: joint swelling (-), joint pain (-), back pain (-) Neuro: weakness (-), numbness (-), headache (-), confusion (-) Skin: Rash (-), lesions (-), dryness (-) Psych: depression (-), suicidal/homicidal ideation (-), feeling of hopelessness (-)  Health Maintenance  Mammogram: 05/05/2012, next appt 11/21 Colonoscopy: 11/2010 Bone Density Scan: 2012 Pap Smear: 05/24/12 (scheduled) Eye Exam: 2 years ago Vitamin D Level: 04/2011 Lipid Panel: 04/2011   PHYSICAL EXAMINATION:  BP 122/81  Pulse 68  Temp 97.8 F (36.6 C) (Oral)  Resp 20  Ht 5' 4.5" (1.638 m)  Wt 182 lb 3.2 oz (82.645 kg)  BMI 30.79 kg/m2 General: Patient is a well appearing female in no acute distress HEENT: PERRLA, sclerae anicteric no conjunctival pallor, MMM Neck: supple, no palpable adenopathy Lungs: clear to auscultation bilaterally, no wheezes,  rhonchi, or rales Cardiovascular: regular rate rhythm, S1, S2, no murmurs, rubs or gallops Abdomen: Soft, non-tender, non-distended, normoactive bowel sounds, no HSM Extremities: warm and well perfused, no clubbing, cyanosis, or edema Skin: No rashes or lesions Neuro: Non-focal Bilateral breast examination right breast reveals well-healed incisional scar no nipple retraction discharge or inversion no masses. Right breast no masses or nipple discharge. ECOG PERFORMANCE STATUS: 0 - Asymptomatic  LABORATORY DATA: Lab Results  Component Value Date   WBC 6.1 05/07/2012   HGB 12.3 05/07/2012   HCT 35.4 05/07/2012   MCV 94.4 05/07/2012   PLT 216 05/07/2012      Chemistry      Component Value Date/Time   NA 144 02/02/2012 1104   K 4.1 02/02/2012 1104   CL 109 02/02/2012 1104   CO2 28 02/02/2012 1104   BUN 16 02/02/2012 1104   CREATININE 0.96 02/02/2012 1104      Component Value Date/Time   CALCIUM 8.6 02/02/2012 1104   ALKPHOS 59 02/02/2012 1104   AST 20 02/02/2012 1104   ALT 20 02/02/2012 1104   BILITOT 0.3 02/02/2012 1104       RADIOGRAPHIC STUDIES:  No results found.  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 Patient underwent a lumpectomy in December 2011. Patient was enrolled on NSABP B. 43 study and was randomized to observation/radiation therapy arm. She has been on tamoxifen after completing radiation. Overall she is tolerating it well.  #2 hot flashes secondary to tamoxifen we discussed the role of SSRIs such as Effexor we discussed the side effects and benefits of this.   PLAN:   #1 Ms. Koch is doing well.  She has no sign of recurrence.    #2 I increased her Effexor to 75 mg daily and refilled her Xanax.    #3 We will see her back in 6 months for follow up.    All questions were answered. The patient knows to call the clinic with any  problems, questions or concerns. We can certainly see the patient much sooner if necessary.  I spent 25 minutes counseling the patient face to face. The total time spent in the appointment was 30 minutes.  This case was reviewed by Dr. Welton Flakes.    Cherie Ouch Lyn Hollingshead, NP Medical Oncology [facility] Phone: [phone] 05/07/2012, 2:37 PM

## 2012-05-07 NOTE — Telephone Encounter (Signed)
gve the pt her may 2014 appt calendar 

## 2012-05-10 ENCOUNTER — Ambulatory Visit
Admission: RE | Admit: 2012-05-10 | Discharge: 2012-05-10 | Disposition: A | Discharge status: Home / Self Care | Payer: BC Managed Care – PPO | Source: Ambulatory Visit | Attending: Gynecology | Admitting: Gynecology

## 2012-05-10 DIAGNOSIS — Z853 Personal history of malignant neoplasm of breast: Secondary | ICD-10-CM

## 2012-05-10 DIAGNOSIS — Z9889 Other specified postprocedural states: Secondary | ICD-10-CM

## 2012-05-28 ENCOUNTER — Other Ambulatory Visit: Payer: Self-pay | Admitting: Gynecology

## 2012-07-26 ENCOUNTER — Ambulatory Visit: Payer: BC Managed Care – PPO | Admitting: Oncology

## 2012-11-05 ENCOUNTER — Telehealth: Payer: Self-pay | Admitting: Oncology

## 2012-11-05 ENCOUNTER — Ambulatory Visit (HOSPITAL_BASED_OUTPATIENT_CLINIC_OR_DEPARTMENT_OTHER): Payer: BC Managed Care – PPO | Admitting: Oncology

## 2012-11-05 ENCOUNTER — Encounter: Payer: Self-pay | Admitting: Oncology

## 2012-11-05 ENCOUNTER — Other Ambulatory Visit (HOSPITAL_BASED_OUTPATIENT_CLINIC_OR_DEPARTMENT_OTHER): Payer: BC Managed Care – PPO | Admitting: Lab

## 2012-11-05 VITALS — BP 108/73 | HR 79 | Temp 98.1°F | Resp 20 | Ht 64.5 in | Wt 179.7 lb

## 2012-11-05 DIAGNOSIS — Z853 Personal history of malignant neoplasm of breast: Secondary | ICD-10-CM

## 2012-11-05 DIAGNOSIS — Z170 Estrogen receptor positive status [ER+]: Secondary | ICD-10-CM

## 2012-11-05 DIAGNOSIS — D0590 Unspecified type of carcinoma in situ of unspecified breast: Secondary | ICD-10-CM

## 2012-11-05 DIAGNOSIS — F411 Generalized anxiety disorder: Secondary | ICD-10-CM

## 2012-11-05 LAB — CBC WITH DIFFERENTIAL/PLATELET
LYMPH%: 24.5 % (ref 14.0–49.7)
MCHC: 33.6 g/dL (ref 31.5–36.0)
MCV: 92.5 fL (ref 79.5–101.0)
Platelets: 225 10*3/uL (ref 145–400)
RBC: 4.07 10*6/uL (ref 3.70–5.45)
RDW: 12.6 % (ref 11.2–14.5)

## 2012-11-05 LAB — COMPREHENSIVE METABOLIC PANEL (CC13)
ALT: 19 U/L (ref 0–55)
Albumin: 3.4 g/dL — ABNORMAL LOW (ref 3.5–5.0)
CO2: 26 mEq/L (ref 22–29)
Calcium: 9 mg/dL (ref 8.4–10.4)
Chloride: 106 mEq/L (ref 98–107)
Glucose: 95 mg/dl (ref 70–99)
Potassium: 4.3 mEq/L (ref 3.5–5.1)
Sodium: 141 mEq/L (ref 136–145)
Total Bilirubin: 0.27 mg/dL (ref 0.20–1.20)
Total Protein: 6.7 g/dL (ref 6.4–8.3)

## 2012-11-05 MED ORDER — TAMOXIFEN CITRATE 20 MG PO TABS: 20.0000 mg | ORAL_TABLET | Freq: Every day | ORAL | Status: DC

## 2012-11-05 NOTE — Patient Instructions (Addendum)
#  1 You're doing well on tamoxifen.  #2 We will continue tamoxifen 20 mg daily for a total of 10 years.  #3 I will plan on seeing you back in 6 months time for followup

## 2012-11-05 NOTE — Telephone Encounter (Signed)
gv pt appt schedule for December.

## 2012-11-05 NOTE — Progress Notes (Signed)
OFFICE PROGRESS NOTE  CC  Gretta Cool, MD [address] [place] [address] Dr. Lurline Hare Dr. 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 Tumor was estrogen receptor +72% progesterone receptor +25%.  #2 patient went on to have a lumpectomy of the left breast on 05/31/2010. 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  #3 Postoperatively Patient was seen in medical oncology she was enrolled on NSABP B. 43 clinical trial. Her tumor was tested for the HER-2/neu receptor and she was positive. She was randomized to observation only.  #4 patient underwent radiation therapy to the left breast which she completed from 07/07/2010 to August 18 2010.  #5 after completion of radiation patient was begun on adjuvant tamoxifen 20 mg daily Starting March 2012.  CURRENT THERAPY:Tamoxifen 20 mg daily since March 2012.  INTERVAL HISTORY: Sara [name] 57 y.o. female returns for follow up today.  She is doing well.    she's doing self breast exams.  She has gained weight, and had questions about what to do to lose weight.  She has now fevers chills she does have flashes. She has no peripheral paresthesias she denies any double vision blurring of vision no lower extremity swelling no chest pains palpitations no vaginal discharge or bleeding. Remainder of the 10 point review of systems is negative.  MEDICAL HISTORY: Past Medical History  Diagnosis Date  . Hypertension   . Breast cancer     ALLERGIES:  has No Known Allergies.  MEDICATIONS:  Current Outpatient Prescriptions  Medication Sig Dispense Refill  .  ALPRAZolam (XANAX) 0.25 MG tablet Take 1 tablet (0.25 mg total) by mouth 3 (three) times daily as needed.  90 tablet  0  . aspirin 81 MG tablet Take 81 mg by mouth daily.        Marland Kitchen Bioflavonoid Products (PERIDIN-C PO) Take 2 tablets by mouth 2 (two) times daily. Two tablets in the morning, one after lunch      . Cholecalciferol (VITAMIN D PO) Take by mouth.        . DIOVAN 160 MG tablet       . Multiple Vitamin (MULTIVITAMIN) capsule Take 1 capsule by mouth daily.        . polyethylene glycol powder (GLYCOLAX/MIRALAX) powder       . tamoxifen (NOLVADEX) 20 MG tablet TAKE 1 TABLET BY MOUTH EVERY DAY  90 tablet  4  . venlafaxine XR (EFFEXOR-XR) 75 MG 24 hr capsule Take 1 capsule (75 mg total) by mouth daily.  90 capsule  6   No current facility-administered medications for this visit.    SURGICAL HISTORY:  Past Surgical History  Procedure Laterality Date  . Breast lumpectomy  05/31/2010    Left Dr Jamey Ripa  . Breast excisional biopsy  05/31/2010    Right     REVIEW OF SYSTEMS:  General: fatigue (-), night sweats (-), fever (-), pain (-) Lymph: palpable nodes (-) HEENT: vision changes (-), mucositis (-), gum bleeding (-), epistaxis (-) Cardiovascular: chest pain (-), palpitations (-) Pulmonary: shortness of breath (-), dyspnea on exertion (-), cough (-), hemoptysis (-) GI:  Early satiety (-), melena (-),  dysphagia (-), nausea/vomiting (-), diarrhea (-) GU: dysuria (-), hematuria (-), incontinence (-) Musculoskeletal: joint swelling (-), joint pain (-), back pain (-) Neuro: weakness (-), numbness (-), headache (-), confusion (-) Skin: Rash (-), lesions (-), dryness (-) Psych: depression (-), suicidal/homicidal ideation (-), feeling of hopelessness (-)  Health Maintenance  Mammogram: 05/05/2012, next appt 11/21 Colonoscopy: 11/2010 Bone Density Scan: 2012 Pap Smear: 05/24/12 (scheduled) Eye Exam: 2 years ago Vitamin D Level: 04/2011 Lipid Panel: 04/2011   PHYSICAL EXAMINATION:   BP 108/73  Pulse 79  Temp(Src) 98.1 F (36.7 C) (Oral)  Resp 20  Ht 5' 4.5" (1.638 m)  Wt 179 lb 11.2 oz (81.511 kg)  BMI 30.38 kg/m2 General: Patient is a well appearing female in no acute distress HEENT: PERRLA, sclerae anicteric no conjunctival pallor, MMM Neck: supple, no palpable adenopathy Lungs: clear to auscultation bilaterally, no wheezes, rhonchi, or rales Cardiovascular: regular rate rhythm, S1, S2, no murmurs, rubs or gallops Abdomen: Soft, non-tender, non-distended, normoactive bowel sounds, no HSM Extremities: warm and well perfused, no clubbing, cyanosis, or edema Skin: No rashes or lesions Neuro: Non-focal Bilateral breast examination right breast reveals well-healed incisional scar no nipple retraction discharge or inversion no masses. Right breast no masses or nipple discharge. ECOG PERFORMANCE STATUS: 0 - Asymptomatic  LABORATORY DATA: Lab Results  Component Value Date   WBC 6.3 11/05/2012   HGB 12.6 11/05/2012   HCT 37.7 11/05/2012   MCV 92.5 11/05/2012   PLT 225 11/05/2012      Chemistry      Component Value Date/Time   NA 139 05/07/2012 1419   NA 144 02/02/2012 1104   K 4.6 05/07/2012 1419   K 4.1 02/02/2012 1104   CL 106 05/07/2012 1419   CL 109 02/02/2012 1104   CO2 30* 05/07/2012 1419   CO2 28 02/02/2012 1104   BUN 18.0 05/07/2012 1419   BUN 16 02/02/2012 1104   CREATININE 1.0 05/07/2012 1419   CREATININE 0.96 02/02/2012 1104      Component Value Date/Time   CALCIUM 9.8 05/07/2012 1419   CALCIUM 8.6 02/02/2012 1104   ALKPHOS 64 05/07/2012 1419   ALKPHOS 59 02/02/2012 1104   AST 20 05/07/2012 1419   AST 20 02/02/2012 1104   ALT 23 05/07/2012 1419   ALT 20 02/02/2012 1104   BILITOT 0.33 05/07/2012 1419   BILITOT 0.3 02/02/2012 1104       RADIOGRAPHIC STUDIES:  No results found.  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 Patient underwent a lumpectomy in December 2011. Patient was enrolled on NSABP B. 43 study and was randomized to observation/radiation therapy arm. She has been on tamoxifen after completing radiation. Overall she is tolerating it well.Patient has no evidence of recurrent disease  #2patient is taking Effexor 75mg  daily. She is also on Xanax for anxiety.  PLAN:  #1 continue tamoxifen 20 mg daily. She will also continue the Effexor.  #2 I will see her back in 6 months time for followup.  All questions were answered. The patient knows to call the clinic with any problems, questions or concerns. We can certainly see the patient much sooner if necessary.  I spent 25 minutes counseling the patient face to face. The total time spent in the appointment was 30 minutes.   Drue Second, MD Medical/Oncology [facility] [phone] (beeper) [phone] (Office)   11/05/2012, 2:54 PM

## 2013-03-15 ENCOUNTER — Encounter (INDEPENDENT_AMBULATORY_CARE_PROVIDER_SITE_OTHER): Payer: Self-pay | Admitting: Surgery

## 2013-03-15 ENCOUNTER — Ambulatory Visit (INDEPENDENT_AMBULATORY_CARE_PROVIDER_SITE_OTHER): Payer: BC Managed Care – PPO | Admitting: Surgery

## 2013-03-15 VITALS — BP 116/84 | HR 74 | Temp 97.6°F | Resp 16 | Ht 64.0 in | Wt 185.0 lb

## 2013-03-15 DIAGNOSIS — Z853 Personal history of malignant neoplasm of breast: Secondary | ICD-10-CM

## 2013-03-15 NOTE — Progress Notes (Signed)
NAME: [name]       DOB: January 06, 1956           DATE: 03/15/2013       MRN: [id number]   Sara [name] is a 57 y.o.Marland Kitchenfemale who presents for routine followup of her Left breast cancer, DCIS, receptor + diagnosed in November, 2011 and treated with lumpectomy, radiation and anti-estrogen. She has no problems or concerns on either side.Hot flashes from Tamoxifen continue  PFSH She has had no significant changes since the last visit here.  ROS There have been no significant changes since the last visit here  General: The patient is alert, oriented, generally healty appearing, NAD. Mood and affect are normal.  Breasts: Right breast is normal. The left breast has minimla post radiation changes. The lumpectomy site is soft and there is no evidence of a recurrent cancer Lymphatics: She has no axillary or supraclavicular adenopathy on either side.  Extremities: Full ROM of the surgical side with no lymphedema noted.  Data Reviewed: Mammogram last NOvember was OK. She is expecting a call son to set up her next one  Impression: Doing well, with no evidence of recurrent cancer or new cancer  Plan: Will continue to follow up on an annual basis here.Mammogram due in November

## 2013-03-15 NOTE — Patient Instructions (Signed)
Continue annual mammograms and follow up

## 2013-05-13 ENCOUNTER — Other Ambulatory Visit: Payer: Self-pay | Admitting: Family Medicine

## 2013-05-13 DIAGNOSIS — Z853 Personal history of malignant neoplasm of breast: Secondary | ICD-10-CM

## 2013-05-27 ENCOUNTER — Telehealth: Payer: Self-pay | Admitting: *Deleted

## 2013-05-27 ENCOUNTER — Encounter: Payer: Self-pay | Admitting: Oncology

## 2013-05-27 ENCOUNTER — Ambulatory Visit (HOSPITAL_BASED_OUTPATIENT_CLINIC_OR_DEPARTMENT_OTHER): Payer: BC Managed Care – PPO | Admitting: Oncology

## 2013-05-27 ENCOUNTER — Other Ambulatory Visit (HOSPITAL_BASED_OUTPATIENT_CLINIC_OR_DEPARTMENT_OTHER): Payer: BC Managed Care – PPO | Admitting: Lab

## 2013-05-27 VITALS — BP 112/78 | HR 80 | Temp 97.9°F | Resp 18 | Ht 64.0 in | Wt 187.3 lb

## 2013-05-27 DIAGNOSIS — F411 Generalized anxiety disorder: Secondary | ICD-10-CM

## 2013-05-27 DIAGNOSIS — D0590 Unspecified type of carcinoma in situ of unspecified breast: Secondary | ICD-10-CM

## 2013-05-27 DIAGNOSIS — Z170 Estrogen receptor positive status [ER+]: Secondary | ICD-10-CM

## 2013-05-27 DIAGNOSIS — F419 Anxiety disorder, unspecified: Secondary | ICD-10-CM

## 2013-05-27 DIAGNOSIS — Z853 Personal history of malignant neoplasm of breast: Secondary | ICD-10-CM

## 2013-05-27 LAB — COMPREHENSIVE METABOLIC PANEL (CC13)
ALT: 22 U/L (ref 0–55)
Albumin: 3.6 g/dL (ref 3.5–5.0)
Alkaline Phosphatase: 65 U/L (ref 40–150)
CO2: 26 mEq/L (ref 22–29)
Potassium: 4.3 mEq/L (ref 3.5–5.1)
Sodium: 141 mEq/L (ref 136–145)
Total Bilirubin: 0.26 mg/dL (ref 0.20–1.20)
Total Protein: 6.9 g/dL (ref 6.4–8.3)

## 2013-05-27 LAB — CBC WITH DIFFERENTIAL/PLATELET
BASO%: 0.6 % (ref 0.0–2.0)
Eosinophils Absolute: 0.3 10*3/uL (ref 0.0–0.5)
LYMPH%: 25.6 % (ref 14.0–49.7)
MCHC: 34.3 g/dL (ref 31.5–36.0)
MONO#: 0.9 10*3/uL (ref 0.1–0.9)
NEUT#: 4.6 10*3/uL (ref 1.5–6.5)
Platelets: 244 10*3/uL (ref 145–400)
RBC: 3.9 10*6/uL (ref 3.70–5.45)
RDW: 12.4 % (ref 11.2–14.5)
WBC: 7.8 10*3/uL (ref 3.9–10.3)

## 2013-05-27 MED ORDER — ALPRAZOLAM 0.5 MG PO TABS: 0.5000 mg | ORAL_TABLET | Freq: Three times a day (TID) | ORAL | Status: DC | PRN

## 2013-05-27 MED ORDER — ALPRAZOLAM 0.5 MG PO TABS: 0.2500 mg | ORAL_TABLET | Freq: Three times a day (TID) | ORAL | Status: DC | PRN

## 2013-05-27 NOTE — Progress Notes (Signed)
OFFICE PROGRESS NOTE  CC  [name], MD [phone] [address] [place] [address] Dr. Lurline Hare Dr. 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 Tumor was estrogen receptor +72% progesterone receptor +25%.  #2 patient went on to have a lumpectomy of the left breast on 05/31/2010. 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  #3 Postoperatively Patient was seen in medical oncology she was enrolled on NSABP B. 43 clinical trial. Her tumor was tested for the HER-2/neu receptor and she was positive. She was randomized to observation only.  #4 patient underwent radiation therapy to the left breast which she completed from 07/07/2010 to August 18 2010.  #5 after completion of radiation patient was begun on adjuvant tamoxifen 20 mg daily Starting March 2012.  CURRENT THERAPY:Tamoxifen 20 mg daily since March 2012.  INTERVAL HISTORY: Sara [name] 57 y.o. female returns for follow up today.  She is doing well.    she's doing self breast exams.  She has gained weight, and had questions about what to do to lose weight.  She has now fevers chills she does have flashes. She has no peripheral paresthesias she denies any double vision blurring of vision no lower extremity swelling no chest pains palpitations no vaginal discharge or bleeding. Remainder of the 10 point review of systems is negative.  MEDICAL HISTORY: Past Medical History  Diagnosis Date  . Hypertension   . Breast cancer     ALLERGIES:  has No Known Allergies.  MEDICATIONS:  Current Outpatient Prescriptions  Medication Sig Dispense Refill   . aspirin 81 MG tablet Take 81 mg by mouth daily.        Marland Kitchen Bioflavonoid Products (PERIDIN-C PO) Take 2 tablets by mouth 2 (two) times daily. Two tablets in the morning, one after lunch      . Cholecalciferol (VITAMIN D PO) Take by mouth.        . DIOVAN 160 MG tablet       . polyethylene glycol powder (GLYCOLAX/MIRALAX) powder       . tamoxifen (NOLVADEX) 20 MG tablet Take 1 tablet (20 mg total) by mouth daily.  90 tablet  7  . ALPRAZolam (XANAX) 0.5 MG tablet Take 1 tablet (0.5 mg total) by mouth 3 (three) times daily as needed for anxiety.  90 tablet  3  . Multiple Vitamin (MULTIVITAMIN) capsule Take 1 capsule by mouth daily.        Marland Kitchen venlafaxine XR (EFFEXOR-XR) 75 MG 24 hr capsule Take 1 capsule (75 mg total) by mouth daily.  90 capsule  6   No current facility-administered medications for this visit.    SURGICAL HISTORY:  Past Surgical History  Procedure Laterality Date  . Breast lumpectomy  05/31/2010    Left Dr Jamey Ripa  . Breast excisional biopsy  05/31/2010    Right     REVIEW OF SYSTEMS:  General: fatigue (-), night sweats (-), fever (-), pain (-) Lymph: palpable nodes (-) HEENT: vision changes (-), mucositis (-), gum bleeding (-), epistaxis (-) Cardiovascular: chest pain (-), palpitations (-) Pulmonary: shortness of breath (-), dyspnea on exertion (-), cough (-), hemoptysis (-) GI:  Early satiety (-), melena (-), dysphagia (-), nausea/vomiting (-), diarrhea (-) GU: dysuria (-), hematuria (-), incontinence (-) Musculoskeletal: joint swelling (-), joint pain (-), back pain (-) Neuro: weakness (-), numbness (-), headache (-), confusion (-) Skin: Rash (-), lesions (-), dryness (-) Psych: depression (-), suicidal/homicidal ideation (-), feeling of hopelessness (-)  Health Maintenance  Mammogram: 05/05/2012, next appt 11/21 Colonoscopy: 11/2010 Bone Density Scan: 2012 Pap Smear: 05/24/12 (scheduled) Eye Exam: 2 years ago Vitamin D Level: 04/2011 Lipid Panel:  04/2011   PHYSICAL EXAMINATION:  BP 112/78  Pulse 80  Temp(Src) 97.9 F (36.6 C) (Oral)  Resp 18  Ht 5\' 4"  (1.626 m)  Wt 187 lb 5 oz (84.964 kg)  BMI 32.14 kg/m2 General: Patient is a well appearing female in no acute distress HEENT: PERRLA, sclerae anicteric no conjunctival pallor, MMM Neck: supple, no palpable adenopathy Lungs: clear to auscultation bilaterally, no wheezes, rhonchi, or rales Cardiovascular: regular rate rhythm, S1, S2, no murmurs, rubs or gallops Abdomen: Soft, non-tender, non-distended, normoactive bowel sounds, no HSM Extremities: warm and well perfused, no clubbing, cyanosis, or edema Skin: No rashes or lesions Neuro: Non-focal Bilateral breast examination right breast reveals well-healed incisional scar no nipple retraction discharge or inversion no masses. Right breast no masses or nipple discharge. ECOG PERFORMANCE STATUS: 0 - Asymptomatic  LABORATORY DATA: Lab Results  Component Value Date   WBC 7.8 05/27/2013   HGB 12.5 05/27/2013   HCT 36.4 05/27/2013   MCV 93.3 05/27/2013   PLT 244 05/27/2013      Chemistry      Component Value Date/Time   NA 141 05/27/2013 1413   NA 144 02/02/2012 1104   K 4.3 05/27/2013 1413   K 4.1 02/02/2012 1104   CL 106 11/05/2012 1419   CL 109 02/02/2012 1104   CO2 26 05/27/2013 1413   CO2 28 02/02/2012 1104   BUN 13.3 05/27/2013 1413   BUN 16 02/02/2012 1104   CREATININE 0.9 05/27/2013 1413   CREATININE 0.96 02/02/2012 1104      Component Value Date/Time   CALCIUM 9.0 05/27/2013 1413   CALCIUM 8.6 02/02/2012 1104   ALKPHOS 65 05/27/2013 1413   ALKPHOS 59 02/02/2012 1104   AST 23 05/27/2013 1413   AST 20 02/02/2012 1104   ALT 22 05/27/2013 1413   ALT 20 02/02/2012 1104   BILITOT 0.26 05/27/2013 1413   BILITOT 0.3 02/02/2012 1104       RADIOGRAPHIC STUDIES:  No results found.  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 Patient underwent a lumpectomy in December 2011. Patient was enrolled on NSABP B. 43 study and was randomized to observation/radiation therapy arm. She has been on tamoxifen after completing radiation. Overall she is tolerating it well.Patient has no evidence of recurrent disease  #2patient is taking Effexor 75mg  daily. She is also on Xanax for anxiety.  PLAN:  #1 continue tamoxifen 20 mg daily. She will also continue the Effexor.  #2 patient was given a prescription for Xanax 0.5 mg #90 with 3 refills  #3 I will see her back in 6 months time for followup.  All questions were answered. The patient knows to call the clinic with any problems, questions or concerns. We can certainly see the patient much sooner if necessary.  I spent 25 minutes counseling the patient face to face. The total time spent in the appointment was 30  minutes.   Drue Second, MD Medical/Oncology [facility] [phone] (beeper) [phone] (Office)   05/27/2013, 2:54 PM

## 2013-05-27 NOTE — Telephone Encounter (Signed)
appts made and printed...td 

## 2013-05-30 ENCOUNTER — Ambulatory Visit
Admission: RE | Admit: 2013-05-30 | Discharge: 2013-05-30 | Disposition: A | Discharge status: Home / Self Care | Payer: BC Managed Care – PPO | Source: Ambulatory Visit | Attending: Family Medicine | Admitting: Family Medicine

## 2013-05-30 DIAGNOSIS — Z853 Personal history of malignant neoplasm of breast: Secondary | ICD-10-CM

## 2013-07-30 ENCOUNTER — Other Ambulatory Visit: Payer: Self-pay | Admitting: Adult Health

## 2013-08-02 ENCOUNTER — Other Ambulatory Visit: Payer: Self-pay | Admitting: *Deleted

## 2013-08-02 DIAGNOSIS — R232 Flushing: Secondary | ICD-10-CM

## 2013-08-02 MED ORDER — VENLAFAXINE HCL ER 75 MG PO CP24: 75.0000 mg | ORAL_CAPSULE | Freq: Every day | ORAL | Status: DC

## 2013-10-11 ENCOUNTER — Other Ambulatory Visit: Payer: Self-pay | Admitting: Gynecology

## 2013-10-11 DIAGNOSIS — M8580 Other specified disorders of bone density and structure, unspecified site: Secondary | ICD-10-CM

## 2013-10-11 DIAGNOSIS — Z780 Asymptomatic menopausal state: Secondary | ICD-10-CM

## 2013-10-21 ENCOUNTER — Ambulatory Visit
Admission: RE | Admit: 2013-10-21 | Discharge: 2013-10-21 | Disposition: A | Discharge status: Home / Self Care | Payer: BC Managed Care – PPO | Source: Ambulatory Visit | Attending: Gynecology | Admitting: Gynecology

## 2013-10-21 ENCOUNTER — Encounter (INDEPENDENT_AMBULATORY_CARE_PROVIDER_SITE_OTHER): Payer: Self-pay

## 2013-10-21 DIAGNOSIS — Z780 Asymptomatic menopausal state: Secondary | ICD-10-CM

## 2013-10-21 DIAGNOSIS — M8580 Other specified disorders of bone density and structure, unspecified site: Secondary | ICD-10-CM

## 2013-11-04 ENCOUNTER — Telehealth: Payer: Self-pay | Admitting: Oncology

## 2013-11-04 NOTE — Telephone Encounter (Signed)
Pt called and r/s labs and Md  per pt rqst

## 2013-11-17 ENCOUNTER — Other Ambulatory Visit: Payer: Self-pay | Admitting: Oncology

## 2013-11-29 ENCOUNTER — Other Ambulatory Visit: Payer: BC Managed Care – PPO

## 2013-11-29 ENCOUNTER — Ambulatory Visit: Payer: BC Managed Care – PPO | Admitting: Oncology

## 2013-12-04 ENCOUNTER — Telehealth: Payer: Self-pay | Admitting: Hematology and Oncology

## 2013-12-04 ENCOUNTER — Other Ambulatory Visit (HOSPITAL_BASED_OUTPATIENT_CLINIC_OR_DEPARTMENT_OTHER): Payer: BC Managed Care – PPO

## 2013-12-04 ENCOUNTER — Ambulatory Visit (HOSPITAL_BASED_OUTPATIENT_CLINIC_OR_DEPARTMENT_OTHER): Payer: BC Managed Care – PPO | Admitting: Hematology and Oncology

## 2013-12-04 VITALS — BP 109/73 | HR 71 | Temp 98.0°F | Resp 20 | Ht 64.0 in | Wt 175.6 lb

## 2013-12-04 DIAGNOSIS — Z853 Personal history of malignant neoplasm of breast: Secondary | ICD-10-CM

## 2013-12-04 DIAGNOSIS — Z170 Estrogen receptor positive status [ER+]: Secondary | ICD-10-CM

## 2013-12-04 DIAGNOSIS — D0590 Unspecified type of carcinoma in situ of unspecified breast: Secondary | ICD-10-CM

## 2013-12-04 DIAGNOSIS — F419 Anxiety disorder, unspecified: Secondary | ICD-10-CM

## 2013-12-04 LAB — CBC WITH DIFFERENTIAL/PLATELET
BASO%: 0.4 % (ref 0.0–2.0)
BASOS ABS: 0 10*3/uL (ref 0.0–0.1)
EOS ABS: 0.1 10*3/uL (ref 0.0–0.5)
EOS%: 1.8 % (ref 0.0–7.0)
HEMATOCRIT: 38.7 % (ref 34.8–46.6)
HEMOGLOBIN: 12.9 g/dL (ref 11.6–15.9)
LYMPH%: 25.1 % (ref 14.0–49.7)
MCH: 31.1 pg (ref 25.1–34.0)
MCHC: 33.3 g/dL (ref 31.5–36.0)
MCV: 93.4 fL (ref 79.5–101.0)
MONO#: 0.6 10*3/uL (ref 0.1–0.9)
MONO%: 8.9 % (ref 0.0–14.0)
NEUT%: 63.8 % (ref 38.4–76.8)
NEUTROS ABS: 4.4 10*3/uL (ref 1.5–6.5)
Platelets: 274 10*3/uL (ref 145–400)
RBC: 4.14 10*6/uL (ref 3.70–5.45)
RDW: 12.6 % (ref 11.2–14.5)
WBC: 7 10*3/uL (ref 3.9–10.3)
lymph#: 1.8 10*3/uL (ref 0.9–3.3)

## 2013-12-04 LAB — COMPREHENSIVE METABOLIC PANEL (CC13)
ALT: 22 U/L (ref 0–55)
ANION GAP: 8 meq/L (ref 3–11)
AST: 19 U/L (ref 5–34)
Albumin: 3.4 g/dL — ABNORMAL LOW (ref 3.5–5.0)
Alkaline Phosphatase: 71 U/L (ref 40–150)
BILIRUBIN TOTAL: 0.35 mg/dL (ref 0.20–1.20)
BUN: 19.8 mg/dL (ref 7.0–26.0)
CALCIUM: 9.4 mg/dL (ref 8.4–10.4)
CHLORIDE: 108 meq/L (ref 98–109)
CO2: 28 meq/L (ref 22–29)
CREATININE: 0.9 mg/dL (ref 0.6–1.1)
GLUCOSE: 96 mg/dL (ref 70–140)
Potassium: 4.2 mEq/L (ref 3.5–5.1)
Sodium: 144 mEq/L (ref 136–145)
Total Protein: 6.8 g/dL (ref 6.4–8.3)

## 2013-12-04 NOTE — Telephone Encounter (Signed)
, °

## 2013-12-04 NOTE — Progress Notes (Signed)
OFFICE PROGRESS NOTE  CC  Vidal Schwalbe, MD [place], [address] [place] [address] Dr. Thea Silversmith Dr. Neldon Mc  CHIEF COMPLAINT: Follow up visit for DCIS  DIAGNOSIS: Left breast DCIS-  ER positive  PRIOR THERAPY:  As per previously documented Dr.Khan' note:   #1 in November 2011 patient presented for a screening mammogram That showed an area of calcifications in the left breast. 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 Tumor was estrogen receptor +72% progesterone receptor +25%.  #2 patient went on to have a lumpectomy of the left breast on 05/31/2010. 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  #3 Postoperatively Patient was seen in medical oncology she was enrolled on NSABP B. 43 clinical trial. Her tumor was tested for the HER-2/neu receptor and she was positive. She was randomized to observation only.  #4 patient underwent radiation therapy to the left breast which she completed from 07/07/2010 to August 18 2010.  #5 after completion of radiation patient was begun on adjuvant tamoxifen 20 mg daily Starting March 2012.  CURRENT THERAPY:Tamoxifen 20 mg daily since March 2012.  INTERVAL HISTORY: Sara [name] 58 y.o. female returns for follow up visit today for left breast DCIS. She is tolerating tamoxifen quite well with occasional hot flashes. Her hot flashesis much improved on Effexor treatment.   she is up-to-date with her self breast examinations and  flu vaccines. she is recently seen by gynecology and had a pelvic exam done.   She is on diet and exercise program and she  lost some weight. Her appetite is good. She denies any shortness of breath, chest pain, palpitation, blood in the stool or blood in the urine. She denies headaches, blurred vision, fever, chills. She says her surgeon Dr. Margot Chimes retired and she is due to  see surgery  at the end of this year    MEDICAL HISTORY: Past Medical History  Diagnosis Date  . Hypertension   . Breast cancer     ALLERGIES:  has No Known Allergies.  MEDICATIONS:  Current Outpatient Prescriptions  Medication Sig Dispense Refill  . ALPRAZolam (XANAX) 0.5 MG tablet Take 1 tablet (0.5 mg total) by mouth 3 (three) times daily as needed for anxiety.  90 tablet  3  . aspirin 81 MG tablet Take 81 mg by mouth daily.        Marland Kitchen Bioflavonoid Products (PERIDIN-C PO) Take 2 tablets by mouth 2 (two) times daily. Two tablets in the morning, one after lunch      . Cholecalciferol (VITAMIN D PO) Take 2,000 Units by mouth daily.       Marland Kitchen DIOVAN 160 MG tablet Take 160 mg by mouth daily.       . Multiple Vitamin (MULTIVITAMIN) capsule Take 1 capsule by mouth daily.        . polyethylene glycol powder (GLYCOLAX/MIRALAX) powder Take 1 Container by mouth as needed.       . tamoxifen (NOLVADEX) 20 MG tablet TAKE 1 TABLET BY MOUTH EVERY DAY  90 tablet  2  . venlafaxine XR (EFFEXOR-XR) 75 MG 24 hr capsule Take 1 capsule (75 mg total) by mouth daily.  90 capsule  1   No current facility-administered medications for this visit.    SURGICAL HISTORY:  Past Surgical History  Procedure Laterality Date  . Breast lumpectomy  05/31/2010    Left Dr  Streck  . Breast excisional biopsy  05/31/2010    Right     REVIEW OF SYSTEMS:  General: fatigue (-), night sweats (-), fever (-), pain (-) Lymph: palpable nodes (-) HEENT: vision changes (-), mucositis (-), gum bleeding (-), epistaxis (-) Cardiovascular: chest pain (-), palpitations (-) Pulmonary: shortness of breath (-), dyspnea on exertion (-), cough (-), hemoptysis (-) GI:  Early satiety (-), melena (-), dysphagia (-), nausea/vomiting (-), diarrhea (-) GU: dysuria (-), hematuria (-), incontinence (-) Musculoskeletal: joint swelling (-), joint pain (-), back pain (-) Neuro: weakness (-), numbness (-), headache (-), confusion (-) Skin: Rash  (-), lesions (-), dryness (-) Psych: depression (-), suicidal/homicidal ideation (-), feeling of hopelessness (-)  Health Maintenance  Mammogram: 05/05/2012, next appt 11/21 Colonoscopy: 11/2010 Bone Density Scan: 2012 Pap Smear: 05/24/12 (scheduled) Eye Exam: 2 years ago Vitamin D Level: 04/2011 Lipid Panel: 04/2011   PHYSICAL EXAMINATION:  BP 109/73  Pulse 71  Temp(Src) 98 F (36.7 C) (Oral)  Resp 20  Ht _0  (1.626 m)  Wt 175 lb 9.6 oz (79.652 kg)  BMI 30.13 kg/m2 General: Patient is a well appearing female in no acute distress HEENT: PERRLA, sclerae anicteric no conjunctival pallor, MMM Neck: supple, no palpable adenopathy Lungs: clear to auscultation bilaterally, no wheezes, rhonchi, or rales Cardiovascular: regular rate rhythm, S1, S2, no murmurs, rubs or gallops Abdomen: Soft, non-tender, non-distended, normoactive bowel sounds, no HSM Extremities: warm and well perfused, no clubbing, cyanosis, or edema Skin: No rashes or lesions Neuro: Non-focal Bilateral breast examination: Left  Breast  revealed no masses, status post lumpectomy scar noted. Right breast no masses or nipple discharge.No bilateral axillary lymphadenopathy   ECOG PERFORMANCE STATUS: 0 - Asymptomatic  LABORATORY DATA: Lab Results  Component Value Date   WBC 7.0 12/04/2013   HGB 12.9 12/04/2013   HCT 38.7 12/04/2013   MCV 93.4 12/04/2013   PLT 274 12/04/2013      Chemistry      Component Value Date/Time   NA 141 05/27/2013 1413   NA 144 02/02/2012 1104   K 4.3 05/27/2013 1413   K 4.1 02/02/2012 1104   CL 106 11/05/2012 1419   CL 109 02/02/2012 1104   CO2 26 05/27/2013 1413   CO2 28 02/02/2012 1104   BUN 13.3 05/27/2013 1413   BUN 16 02/02/2012 1104   CREATININE 0.9 05/27/2013 1413   CREATININE 0.96 02/02/2012 1104      Component Value Date/Time   CALCIUM 9.0 05/27/2013 1413   CALCIUM 8.6 02/02/2012 1104   ALKPHOS 65 05/27/2013 1413   ALKPHOS 59 02/02/2012 1104   AST 23 05/27/2013 1413   AST 20  02/02/2012 1104   ALT 22 05/27/2013 1413   ALT 20 02/02/2012 1104   BILITOT 0.26 05/27/2013 1413   BILITOT 0.3 02/02/2012 1104       RADIOGRAPHIC STUDIES:  No results found.  ASSESSMENT: 58 year old female with  #1intermediate grade ductal carcinoma in situ measuring 1.2 cm that was ER positive PR positive HER-2/neu positive. Patient underwent a lumpectomy in December 2011. Patient was enrolled on NSABP B. 43 study and was randomized to observation/radiation therapy arm. She has been on tamoxifen since March 2012 after completing radiation. Overall she is tolerating it well.Patient has no evidence of recurrent disease  #2. Hot flashes: patient is on Effexor 66m daily.   PLAN:  #1 CPfefferkorn is tolerating tamoxifen quite well. continue tamoxifen 20 mg daily.  Her hot flashes are controlled with Effexor.  continue  Effexor 75 mg once daily. She is rarely requiring Xanax. I have reviewed her CBC and CMP those are within normal range. We'll schedule for annual diagnostic mammogram in December 2015  #2 follow up with the surgery as scheduled end of this year  #3 follow up with gynecology for annual pelvic exam and Pap smears while she is on tamoxifen   #4 next followup visit in 6 months with CBC and differential and CMP   All questions were answered. The patient knows to call the clinic with any problems, questions or concerns. We can certainly see the patient much sooner if necessary.  I spent 20 minutes counseling the patient face to face. The total time spent in the appointment was 30 minutes.    Wilmon Arms, MD Medical/Oncology [facility] [phone] (Office)   12/04/2013, 2:00 PM

## 2014-02-04 ENCOUNTER — Other Ambulatory Visit: Payer: Self-pay | Admitting: Surgery

## 2014-04-04 ENCOUNTER — Other Ambulatory Visit: Payer: Self-pay

## 2014-06-06 ENCOUNTER — Other Ambulatory Visit: Payer: Self-pay

## 2014-06-06 ENCOUNTER — Ambulatory Visit
Admission: RE | Admit: 2014-06-06 | Discharge: 2014-06-06 | Disposition: A | Discharge status: Home / Self Care | Payer: BC Managed Care – PPO | Source: Ambulatory Visit | Attending: Hematology and Oncology | Admitting: Hematology and Oncology

## 2014-06-06 DIAGNOSIS — Z853 Personal history of malignant neoplasm of breast: Secondary | ICD-10-CM

## 2014-06-09 ENCOUNTER — Other Ambulatory Visit: Payer: Self-pay | Admitting: *Deleted

## 2014-06-09 ENCOUNTER — Ambulatory Visit (HOSPITAL_BASED_OUTPATIENT_CLINIC_OR_DEPARTMENT_OTHER): Payer: BC Managed Care – PPO | Admitting: Hematology and Oncology

## 2014-06-09 ENCOUNTER — Ambulatory Visit (HOSPITAL_BASED_OUTPATIENT_CLINIC_OR_DEPARTMENT_OTHER): Payer: BC Managed Care – PPO | Admitting: Lab

## 2014-06-09 DIAGNOSIS — C50212 Malignant neoplasm of upper-inner quadrant of left female breast: Secondary | ICD-10-CM

## 2014-06-09 DIAGNOSIS — Z853 Personal history of malignant neoplasm of breast: Secondary | ICD-10-CM

## 2014-06-09 LAB — CBC WITH DIFFERENTIAL/PLATELET
BASO%: 0.3 % (ref 0.0–2.0)
Basophils Absolute: 0 10*3/uL (ref 0.0–0.1)
EOS%: 3 % (ref 0.0–7.0)
Eosinophils Absolute: 0.2 10*3/uL (ref 0.0–0.5)
HCT: 38.7 % (ref 34.8–46.6)
HGB: 13.1 g/dL (ref 11.6–15.9)
LYMPH#: 1.8 10*3/uL (ref 0.9–3.3)
LYMPH%: 22.1 % (ref 14.0–49.7)
MCH: 31 pg (ref 25.1–34.0)
MCHC: 33.9 g/dL (ref 31.5–36.0)
MCV: 91.5 fL (ref 79.5–101.0)
MONO#: 0.7 10*3/uL (ref 0.1–0.9)
MONO%: 8.6 % (ref 0.0–14.0)
NEUT#: 5.3 10*3/uL (ref 1.5–6.5)
NEUT%: 66 % (ref 38.4–76.8)
Platelets: 253 10*3/uL (ref 145–400)
RBC: 4.23 10*6/uL (ref 3.70–5.45)
RDW: 12.9 % (ref 11.2–14.5)
WBC: 8 10*3/uL (ref 3.9–10.3)

## 2014-06-09 LAB — COMPREHENSIVE METABOLIC PANEL (CC13)
ALBUMIN: 3.4 g/dL — AB (ref 3.5–5.0)
ALK PHOS: 81 U/L (ref 40–150)
ALT: 24 U/L (ref 0–55)
AST: 19 U/L (ref 5–34)
Anion Gap: 10 mEq/L (ref 3–11)
BUN: 19.3 mg/dL (ref 7.0–26.0)
CALCIUM: 9.2 mg/dL (ref 8.4–10.4)
CO2: 27 mEq/L (ref 22–29)
Chloride: 105 mEq/L (ref 98–109)
Creatinine: 1 mg/dL (ref 0.6–1.1)
EGFR: 63 mL/min/{1.73_m2} — AB (ref >90)
GLUCOSE: 102 mg/dL (ref 70–140)
POTASSIUM: 3.9 meq/L (ref 3.5–5.1)
Sodium: 142 mEq/L (ref 136–145)
Total Bilirubin: 0.25 mg/dL (ref 0.20–1.20)
Total Protein: 6.8 g/dL (ref 6.4–8.3)

## 2014-06-09 MED ORDER — ALPRAZOLAM 0.5 MG PO TABS: 0.5000 mg | ORAL_TABLET | Freq: Three times a day (TID) | ORAL | Status: AC | PRN

## 2014-06-09 MED ORDER — ALPRAZOLAM 0.5 MG PO TABS
0.5000 mg | ORAL_TABLET | Freq: Three times a day (TID) | ORAL | Status: DC | PRN
Start: 2014-06-09 — End: 2014-06-09

## 2014-06-09 NOTE — Progress Notes (Signed)
Patient Care Team: Vidal Schwalbe, MD as PCP - General (Family Medicine) Deatra Robinson, MD (Internal Medicine) Thea Silversmith, MD (Radiation Oncology)  DIAGNOSIS: No matching staging information was found for the patient.  SUMMARY OF ONCOLOGIC HISTORY:   Breast cancer of upper-inner quadrant of left female breast   05/10/2010 Mammogram Calcifications in the left breast biopsy showed DCIS with calcifications and necrosis high-grade ER 72% PR 25%   05/31/2010 Surgery Left breast lumpectomy: 1.2 cm intermediate grade DCIS EF 72% via 25%    Procedure NSABP B 43 clinical trial: Patient's DCIS was tested was HER-2 positive, randomized to observation only   06/28/2010 - 08/18/2010 Radiation Therapy Adjuvant radiation therapy   09/08/2010 -  Anti-estrogen oral therapy Tamoxifen 20 mg daily    CHIEF COMPLIANT: Follow-up of DCIS  INTERVAL HISTORY: Sara [name] is a 58 year old Caucasian female with above-mentioned history of DCIS involving the left breast treated with lumpectomy followed by adjuvant radiation therapy and is on tamoxifen since March 2012. She has been tolerating tamoxifen extremely well without any major problems or concerns. She does have hot flashes which are being treated with Effexor. She has taken this medicine for over 3-1/2 years.  REVIEW OF SYSTEMS:   Constitutional: Denies fevers, chills or abnormal weight loss Eyes: Denies blurriness of vision Ears, nose, mouth, throat, and face: Denies mucositis or sore throat Respiratory: Denies cough, dyspnea or wheezes Cardiovascular: Denies palpitation, chest discomfort or lower extremity swelling Gastrointestinal:  Denies nausea, heartburn or change in bowel habits Skin: Denies abnormal skin rashes Lymphatics: Denies new lymphadenopathy or easy bruising Neurological:Denies numbness, tingling or new weaknesses Behavioral/Psych: Mood is stable, no new changes  Breast:  denies any pain or lumps or nodules in either breasts All other  systems were reviewed with the patient and are negative.  I have reviewed the past medical history, past surgical history, social history and family history with the patient and they are unchanged from previous note.  ALLERGIES:  has No Known Allergies.  MEDICATIONS:  Current Outpatient Prescriptions  Medication Sig Dispense Refill  . ALPRAZolam (XANAX) 0.5 MG tablet Take 1 tablet (0.5 mg total) by mouth 3 (three) times daily as needed for anxiety. 90 tablet 3  . aspirin 81 MG tablet Take 81 mg by mouth daily.      Marland Kitchen Bioflavonoid Products (PERIDIN-C PO) Take 2 tablets by mouth 2 (two) times daily. Two tablets in the morning, one after lunch    . Cholecalciferol (VITAMIN D PO) Take 2,000 Units by mouth daily.     Marland Kitchen DIOVAN 160 MG tablet Take 160 mg by mouth daily.     . Multiple Vitamin (MULTIVITAMIN) capsule Take 1 capsule by mouth daily.      . polyethylene glycol powder (GLYCOLAX/MIRALAX) powder Take 1 Container by mouth as needed.     . tamoxifen (NOLVADEX) 20 MG tablet TAKE 1 TABLET BY MOUTH EVERY DAY 90 tablet 2  . venlafaxine XR (EFFEXOR-XR) 75 MG 24 hr capsule Take 1 capsule (75 mg total) by mouth daily. 90 capsule 1   No current facility-administered medications for this visit.    PHYSICAL EXAMINATION: ECOG PERFORMANCE STATUS: 0 - Asymptomatic  Filed Vitals:   06/09/14 1601  BP: 118/78  Pulse: 81  Temp: 98 F (36.7 C)  Resp: 20   Filed Weights   06/09/14 1601  Weight: 175 lb 12.8 oz (79.742 kg)    GENERAL:alert, no distress and comfortable SKIN: skin color, texture, turgor are normal, no rashes or  significant lesions EYES: normal, Conjunctiva are pink and non-injected, sclera clear OROPHARYNX:no exudate, no erythema and lips, buccal mucosa, and tongue normal  NECK: supple, thyroid normal size, non-tender, without nodularity LYMPH:  no palpable lymphadenopathy in the cervical, axillary or inguinal LUNGS: clear to auscultation and percussion with normal breathing  effort HEART: regular rate & rhythm and no murmurs and no lower extremity edema ABDOMEN:abdomen soft, non-tender and normal bowel sounds Musculoskeletal:no cyanosis of digits and no clubbing  NEURO: alert & oriented x 3 with fluent speech, no focal motor/sensory deficits BREAST: No palpable masses or nodules in either right or left breasts. No palpable axillary supraclavicular or infraclavicular adenopathy no breast tenderness or nipple discharge.   LABORATORY DATA:  I have reviewed the data as listed   Chemistry      Component Value Date/Time   NA 142 06/09/2014 1530   NA 144 02/02/2012 1104   K 3.9 06/09/2014 1530   K 4.1 02/02/2012 1104   CL 106 11/05/2012 1419   CL 109 02/02/2012 1104   CO2 27 06/09/2014 1530   CO2 28 02/02/2012 1104   BUN 19.3 06/09/2014 1530   BUN 16 02/02/2012 1104   CREATININE 1.0 06/09/2014 1530   CREATININE 0.96 02/02/2012 1104      Component Value Date/Time   CALCIUM 9.2 06/09/2014 1530   CALCIUM 8.6 02/02/2012 1104   ALKPHOS 81 06/09/2014 1530   ALKPHOS 59 02/02/2012 1104   AST 19 06/09/2014 1530   AST 20 02/02/2012 1104   ALT 24 06/09/2014 1530   ALT 20 02/02/2012 1104   BILITOT 0.25 06/09/2014 1530   BILITOT 0.3 02/02/2012 1104       Lab Results  Component Value Date   WBC 8.0 06/09/2014   HGB 13.1 06/09/2014   HCT 38.7 06/09/2014   MCV 91.5 06/09/2014   PLT 253 06/09/2014   NEUTROABS 5.3 06/09/2014     RADIOGRAPHIC STUDIES: I have personally reviewed the radiology reports and agreed with their findings. Mammogram 06/06/2014 is normal  ASSESSMENT & PLAN:  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   Tamoxifen toxicities: 1. Hot flashes being treated with Effexor 75  mg daily.  Surveillance: 1. Breast exam 06/09/2014 normal 2. Mammogram 06/06/2014 normal  Survivorship:Discussed the importance of physical exercise in decreasing the likelihood of breast cancer recurrence. Recommended 30 mins daily 6 days a week of either brisk walking or cycling or swimming. Encouraged patient to eat more fruits and vegetables and decrease red meat.     No orders of the defined types were placed in this encounter.   The patient has a good understanding of the overall plan. she agrees with it. She will call with any problems that may develop before her next visit here.   Rulon Eisenmenger, MD 06/09/2014 4:50 PM

## 2014-06-09 NOTE — Assessment & Plan Note (Signed)
Left breast intermediate grade DCIS 1.2 cm ER/PR positive HER-2 negative status post lumpectomy December 2011, enrolled on NSABP be 43 clinical trial randomized to observation arm status post radiation and currently on tamoxifen since March 2012.   Tamoxifen toxicities: 1. Hot flashes being treated with Effexor 75 mg daily.  Surveillance: 1. Breast exam 06/09/2014 normal 2. Mammogram 06/06/2014 normal  Survivorship:Discussed the importance of physical exercise in decreasing the likelihood of breast cancer recurrence. Recommended 30 mins daily 6 days a week of either brisk walking or cycling or swimming. Encouraged patient to eat more fruits and vegetables and decrease red meat.

## 2014-06-10 ENCOUNTER — Telehealth: Payer: Self-pay | Admitting: Hematology and Oncology

## 2014-06-10 NOTE — Telephone Encounter (Signed)
, °

## 2014-07-16 ENCOUNTER — Telehealth: Payer: Self-pay | Admitting: Hematology and Oncology

## 2014-07-16 NOTE — Telephone Encounter (Signed)
due to call day 6/22 appt moved to AM. left message for pt and mailed schedule.

## 2014-08-21 ENCOUNTER — Other Ambulatory Visit: Payer: Self-pay | Admitting: *Deleted

## 2014-08-21 MED ORDER — TAMOXIFEN CITRATE 20 MG PO TABS: 20.0000 mg | ORAL_TABLET | Freq: Every day | ORAL | Status: DC

## 2014-11-12 ENCOUNTER — Other Ambulatory Visit: Payer: Self-pay | Admitting: Hematology and Oncology

## 2014-11-13 ENCOUNTER — Other Ambulatory Visit: Payer: Self-pay | Admitting: *Deleted

## 2014-11-13 DIAGNOSIS — R232 Flushing: Secondary | ICD-10-CM

## 2014-11-13 MED ORDER — VENLAFAXINE HCL ER 75 MG PO CP24: 75.0000 mg | ORAL_CAPSULE | Freq: Every day | ORAL | Status: DC

## 2014-12-09 ENCOUNTER — Other Ambulatory Visit: Payer: Self-pay | Admitting: *Deleted

## 2014-12-09 DIAGNOSIS — C50212 Malignant neoplasm of upper-inner quadrant of left female breast: Secondary | ICD-10-CM

## 2014-12-09 NOTE — Assessment & Plan Note (Signed)
Left breast intermediate grade DCIS 1.2 cm ER/PR positive HER-2 negative status post lumpectomy December 2011, enrolled on NSABP be 43 clinical trial randomized to observation arm status post radiation and currently on tamoxifen since March 2012.   Tamoxifen toxicities: 1. Hot flashes being treated with Effexor 75 mg daily.  Surveillance: 1. Breast exam 06/09/2014 normal 2. Mammogram 06/06/2014 normal  RTC in 1 year for follow up.

## 2014-12-10 ENCOUNTER — Telehealth: Payer: Self-pay | Admitting: Hematology and Oncology

## 2014-12-10 ENCOUNTER — Ambulatory Visit (HOSPITAL_BASED_OUTPATIENT_CLINIC_OR_DEPARTMENT_OTHER): Payer: BC Managed Care – PPO | Admitting: Hematology and Oncology

## 2014-12-10 ENCOUNTER — Encounter: Payer: Self-pay | Admitting: Hematology and Oncology

## 2014-12-10 ENCOUNTER — Other Ambulatory Visit (HOSPITAL_BASED_OUTPATIENT_CLINIC_OR_DEPARTMENT_OTHER): Payer: BC Managed Care – PPO

## 2014-12-10 VITALS — BP 124/81 | HR 62 | Temp 98.4°F | Resp 18 | Ht 64.0 in | Wt 179.9 lb

## 2014-12-10 DIAGNOSIS — Z79810 Long term (current) use of selective estrogen receptor modulators (SERMs): Secondary | ICD-10-CM | POA: Diagnosis not present

## 2014-12-10 DIAGNOSIS — C50212 Malignant neoplasm of upper-inner quadrant of left female breast: Secondary | ICD-10-CM

## 2014-12-10 DIAGNOSIS — Z170 Estrogen receptor positive status [ER+]: Secondary | ICD-10-CM

## 2014-12-10 DIAGNOSIS — D0512 Intraductal carcinoma in situ of left breast: Secondary | ICD-10-CM

## 2014-12-10 DIAGNOSIS — N951 Menopausal and female climacteric states: Secondary | ICD-10-CM | POA: Diagnosis not present

## 2014-12-10 LAB — CBC WITH DIFFERENTIAL/PLATELET
BASO%: 0.8 % (ref 0.0–2.0)
Basophils Absolute: 0 10*3/uL (ref 0.0–0.1)
EOS%: 3.4 % (ref 0.0–7.0)
Eosinophils Absolute: 0.2 10*3/uL (ref 0.0–0.5)
HCT: 38.4 % (ref 34.8–46.6)
HGB: 13.1 g/dL (ref 11.6–15.9)
LYMPH#: 2.1 10*3/uL (ref 0.9–3.3)
LYMPH%: 32.9 % (ref 14.0–49.7)
MCH: 31.1 pg (ref 25.1–34.0)
MCHC: 34.1 g/dL (ref 31.5–36.0)
MCV: 91.5 fL (ref 79.5–101.0)
MONO#: 0.8 10*3/uL (ref 0.1–0.9)
MONO%: 11.9 % (ref 0.0–14.0)
NEUT%: 51 % (ref 38.4–76.8)
NEUTROS ABS: 3.2 10*3/uL (ref 1.5–6.5)
PLATELETS: 220 10*3/uL (ref 145–400)
RBC: 4.2 10*6/uL (ref 3.70–5.45)
RDW: 13.1 % (ref 11.2–14.5)
WBC: 6.3 10*3/uL (ref 3.9–10.3)

## 2014-12-10 LAB — COMPREHENSIVE METABOLIC PANEL (CC13)
ALT: 17 U/L (ref 0–55)
AST: 17 U/L (ref 5–34)
Albumin: 3.4 g/dL — ABNORMAL LOW (ref 3.5–5.0)
Alkaline Phosphatase: 68 U/L (ref 40–150)
Anion Gap: 5 mEq/L (ref 3–11)
BUN: 18.7 mg/dL (ref 7.0–26.0)
CALCIUM: 9 mg/dL (ref 8.4–10.4)
CO2: 27 mEq/L (ref 22–29)
CREATININE: 0.9 mg/dL (ref 0.6–1.1)
Chloride: 107 mEq/L (ref 98–109)
EGFR: 76 mL/min/{1.73_m2} — ABNORMAL LOW (ref >90)
Glucose: 98 mg/dl (ref 70–140)
Potassium: 4.6 mEq/L (ref 3.5–5.1)
Sodium: 140 mEq/L (ref 136–145)
Total Bilirubin: 0.3 mg/dL (ref 0.20–1.20)
Total Protein: 6.3 g/dL — ABNORMAL LOW (ref 6.4–8.3)

## 2014-12-10 NOTE — Progress Notes (Signed)
Patient Care Team: Harlan Stains, MD as PCP - General (Family Medicine) Consuela Mimes, MD (Internal Medicine) Thea Silversmith, MD (Radiation Oncology)  DIAGNOSIS: No matching staging information was found for the patient.  SUMMARY OF ONCOLOGIC HISTORY:   Breast cancer of upper-inner quadrant of left female breast   05/10/2010 Mammogram Calcifications in the left breast biopsy showed DCIS with calcifications and necrosis high-grade ER 72% PR 25%   05/31/2010 Surgery Left breast lumpectomy: 1.2 cm intermediate grade DCIS EF 72% via 25%    Procedure NSABP B 43 clinical trial: Patient's DCIS was tested was HER-2 positive, randomized to observation only   06/28/2010 - 08/18/2010 Radiation Therapy Adjuvant radiation therapy   09/08/2010 -  Anti-estrogen oral therapy Tamoxifen 20 mg daily    CHIEF COMPLIANT: F/U of Left breast DCIS  INTERVAL HISTORY: Sara [name] is a patient with left breast DCIS currently on Tamoxifen. Has hot flashes but otherwise tolerating it well. Denies any lumps or nodules. 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  REVIEW OF SYSTEMS:   Constitutional: Denies fevers, chills or abnormal weight loss Eyes: Denies blurriness of vision Ears, nose, mouth, throat, and face: Denies mucositis or sore throat Respiratory: Denies cough, dyspnea or wheezes Cardiovascular: Denies palpitation, chest discomfort or lower extremity swelling Gastrointestinal:  Denies nausea, heartburn or change in bowel habits Skin: Denies abnormal skin rashes Lymphatics: Denies new lymphadenopathy or easy bruising Neurological:Denies numbness, tingling or new weaknesses Behavioral/Psych: Mood is stable, no new changes  Breast:  denies any pain or lumps or nodules in either breasts All other systems were reviewed with the patient and are negative.  I have reviewed the past medical history, past surgical history, social history and family history  with the patient and they are unchanged from previous note.  ALLERGIES:  has No Known Allergies.  MEDICATIONS:  Current Outpatient Prescriptions  Medication Sig Dispense Refill  . ALPRAZolam (XANAX) 0.5 MG tablet Take 1 tablet (0.5 mg total) by mouth 3 (three) times daily as needed for anxiety. 90 tablet 3  . aspirin 81 MG tablet Take 81 mg by mouth daily.      Marland Kitchen Bioflavonoid Products (PERIDIN-C PO) Take 2 tablets by mouth 2 (two) times daily. Two tablets in the morning, one after lunch    . Cholecalciferol (VITAMIN D PO) Take 2,000 Units by mouth daily.     Marland Kitchen DIOVAN 160 MG tablet Take 160 mg by mouth daily.     . Multiple Vitamin (MULTIVITAMIN) capsule Take 1 capsule by mouth daily.      . polyethylene glycol powder (GLYCOLAX/MIRALAX) powder Take 1 Container by mouth as needed.     . tamoxifen (NOLVADEX) 20 MG tablet Take 1 tablet (20 mg total) by mouth daily. 90 tablet 4  . venlafaxine XR (EFFEXOR-XR) 75 MG 24 hr capsule Take 1 capsule (75 mg total) by mouth daily. 90 capsule 1   No current facility-administered medications for this visit.    PHYSICAL EXAMINATION: ECOG PERFORMANCE STATUS: 0 - Asymptomatic  Filed Vitals:   12/10/14 0841  BP: 124/81  Pulse: 62  Temp: 98.4 F (36.9 C)  Resp: 18   Filed Weights   12/10/14 0841  Weight: 179 lb 14.4 oz (81.602 kg)    GENERAL:alert, no distress and comfortable SKIN: skin color, texture, turgor are normal, no rashes or significant lesions EYES: normal, Conjunctiva are pink and non-injected, sclera clear OROPHARYNX:no exudate, no erythema and lips, buccal mucosa, and tongue  normal  NECK: supple, thyroid normal size, non-tender, without nodularity LYMPH:  no palpable lymphadenopathy in the cervical, axillary or inguinal LUNGS: clear to auscultation and percussion with normal breathing effort HEART: regular rate & rhythm and no murmurs and no lower extremity edema ABDOMEN:abdomen soft, non-tender and normal bowel  sounds Musculoskeletal:no cyanosis of digits and no clubbing  NEURO: alert & oriented x 3 with fluent speech, no focal motor/sensory deficits BREAST: No palpable masses or nodules in either right or left breasts. No palpable axillary supraclavicular or infraclavicular adenopathy no breast tenderness or nipple discharge. (exam performed in the presence of a chaperone)  LABORATORY DATA:  I have reviewed the data as listed   Chemistry      Component Value Date/Time   NA 142 06/09/2014 1530   NA 144 02/02/2012 1104   K 3.9 06/09/2014 1530   K 4.1 02/02/2012 1104   CL 106 11/05/2012 1419   CL 109 02/02/2012 1104   CO2 27 06/09/2014 1530   CO2 28 02/02/2012 1104   BUN 19.3 06/09/2014 1530   BUN 16 02/02/2012 1104   CREATININE 1.0 06/09/2014 1530   CREATININE 0.96 02/02/2012 1104      Component Value Date/Time   CALCIUM 9.2 06/09/2014 1530   CALCIUM 8.6 02/02/2012 1104   ALKPHOS 81 06/09/2014 1530   ALKPHOS 59 02/02/2012 1104   AST 19 06/09/2014 1530   AST 20 02/02/2012 1104   ALT 24 06/09/2014 1530   ALT 20 02/02/2012 1104   BILITOT 0.25 06/09/2014 1530   BILITOT 0.3 02/02/2012 1104       Lab Results  Component Value Date   WBC 6.3 12/10/2014   HGB 13.1 12/10/2014   HCT 38.4 12/10/2014   MCV 91.5 12/10/2014   PLT 220 12/10/2014   NEUTROABS 3.2 12/10/2014    ASSESSMENT & PLAN:  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   Tamoxifen toxicities: 1. Hot flashes being treated with Effexor 75 mg daily.  Surveillance: 1. Breast exam 12/10/14 normal 2. Mammogram 06/06/2014 normal  RTC in 6 months for follow up and after that once a year with survivorship NP.   No orders of the defined types were placed in this  encounter.   The patient has a good understanding of the overall plan. she agrees with it. she will call with any problems that may develop before the next visit here.   Rulon Eisenmenger, MD

## 2014-12-10 NOTE — Telephone Encounter (Signed)
Appointments made and avs printed for patient °

## 2015-01-30 ENCOUNTER — Telehealth: Payer: Self-pay | Admitting: Hematology and Oncology

## 2015-01-30 NOTE — Telephone Encounter (Signed)
Called and left a message with new appointment on 12/28

## 2015-05-04 ENCOUNTER — Other Ambulatory Visit: Payer: Self-pay | Admitting: Family Medicine

## 2015-05-04 DIAGNOSIS — Z1231 Encounter for screening mammogram for malignant neoplasm of breast: Secondary | ICD-10-CM

## 2015-05-04 DIAGNOSIS — Z9889 Other specified postprocedural states: Secondary | ICD-10-CM

## 2015-05-06 ENCOUNTER — Other Ambulatory Visit: Payer: Self-pay | Admitting: Hematology and Oncology

## 2015-05-06 NOTE — Telephone Encounter (Signed)
Chart reviewed.

## 2015-05-18 ENCOUNTER — Other Ambulatory Visit (HOSPITAL_COMMUNITY)
Admission: RE | Admit: 2015-05-18 | Discharge: 2015-05-18 | Disposition: A | Discharge status: Home / Self Care | Payer: BC Managed Care – PPO | Source: Ambulatory Visit | Attending: Family Medicine | Admitting: Family Medicine

## 2015-05-18 ENCOUNTER — Other Ambulatory Visit: Payer: Self-pay | Admitting: Family Medicine

## 2015-05-18 DIAGNOSIS — Z124 Encounter for screening for malignant neoplasm of cervix: Secondary | ICD-10-CM | POA: Diagnosis present

## 2015-05-22 LAB — CYTOLOGY - PAP

## 2015-06-11 ENCOUNTER — Ambulatory Visit
Admission: RE | Admit: 2015-06-11 | Discharge: 2015-06-11 | Disposition: A | Discharge status: Home / Self Care | Payer: BC Managed Care – PPO | Source: Ambulatory Visit | Attending: Family Medicine | Admitting: Family Medicine

## 2015-06-11 DIAGNOSIS — Z9889 Other specified postprocedural states: Secondary | ICD-10-CM

## 2015-06-16 NOTE — Assessment & Plan Note (Signed)
Left breast intermediate grade DCIS 1.2 cm ER/PR positive HER-2 negative status post lumpectomy December 2011, enrolled on NSABP be 43 clinical trial randomized to observation arm status post radiation and currently on tamoxifen since March 2012.   Tamoxifen toxicities: 1. Hot flashes being treated with Effexor 75 mg daily. Tamoxifen will complete March 2017  Surveillance: 1. Breast exam 06/17/15 normal 2. Mammogram 06/11/15 Normal  RTC once a year with survivorship NP.

## 2015-06-17 ENCOUNTER — Telehealth: Payer: Self-pay | Admitting: Hematology and Oncology

## 2015-06-17 ENCOUNTER — Encounter: Payer: Self-pay | Admitting: Hematology and Oncology

## 2015-06-17 ENCOUNTER — Ambulatory Visit (HOSPITAL_BASED_OUTPATIENT_CLINIC_OR_DEPARTMENT_OTHER): Payer: BC Managed Care – PPO | Admitting: Hematology and Oncology

## 2015-06-17 ENCOUNTER — Ambulatory Visit: Payer: BC Managed Care – PPO | Admitting: Hematology and Oncology

## 2015-06-17 VITALS — BP 118/79 | HR 75 | Temp 97.7°F | Resp 18 | Ht 64.0 in | Wt 185.3 lb

## 2015-06-17 DIAGNOSIS — Z170 Estrogen receptor positive status [ER+]: Secondary | ICD-10-CM

## 2015-06-17 DIAGNOSIS — D0512 Intraductal carcinoma in situ of left breast: Secondary | ICD-10-CM

## 2015-06-17 DIAGNOSIS — Z79810 Long term (current) use of selective estrogen receptor modulators (SERMs): Secondary | ICD-10-CM

## 2015-06-17 DIAGNOSIS — C50212 Malignant neoplasm of upper-inner quadrant of left female breast: Secondary | ICD-10-CM

## 2015-06-17 NOTE — Telephone Encounter (Signed)
Appointments made and avs printed for patient °

## 2015-06-17 NOTE — Progress Notes (Signed)
Patient Care Team: Harlan Stains, MD as PCP - General (Family Medicine) Consuela Mimes, MD (Internal Medicine) Thea Silversmith, MD (Radiation Oncology)  DIAGNOSIS: No matching staging information was found for the patient.  SUMMARY OF ONCOLOGIC HISTORY:   Breast cancer of upper-inner quadrant of left female breast ([place])   05/10/2010 Mammogram Calcifications in the left breast biopsy showed DCIS with calcifications and necrosis high-grade ER 72% PR 25%   05/31/2010 Surgery Left breast lumpectomy: 1.2 cm intermediate grade DCIS EF 72% via 25%    Procedure NSABP B 43 clinical trial: Patient's DCIS was tested was HER-2 positive, randomized to observation only   06/28/2010 - 08/18/2010 Radiation Therapy Adjuvant radiation therapy   09/08/2010 -  Anti-estrogen oral therapy Tamoxifen 20 mg daily    CHIEF COMPLIANT: follow-up on tamoxifen therapy  INTERVAL HISTORY: Sara [name] is a 59 year old with above-mentioned history of left breast DCIS status post lumpectomy and radiation and is currently on tamoxifen therapy. She has been enrolled on NSABP be 43 clinical trial. She was randomized to observation only. She will complete 5 years of tamoxifen therapy as of March 2017. She reports no major problems or concerns other than hot flashes.she denies any lumps or nodules in the breasts.  REVIEW OF SYSTEMS:   Constitutional: Denies fevers, chills or abnormal weight loss Eyes: Denies blurriness of vision Ears, nose, mouth, throat, and face: Denies mucositis or sore throat Respiratory: Denies cough, dyspnea or wheezes Cardiovascular: Denies palpitation, chest discomfort Gastrointestinal:  Denies nausea, heartburn or change in bowel habits Skin: Denies abnormal skin rashes Lymphatics: Denies new lymphadenopathy or easy bruising Neurological:Denies numbness, tingling or new weaknesses Behavioral/Psych: Mood is stable, no new changes  Extremities: No lower extremity edema Breast:  denies any pain or  lumps or nodules in either breasts All other systems were reviewed with the patient and are negative.  I have reviewed the past medical history, past surgical history, social history and family history with the patient and they are unchanged from previous note.  ALLERGIES:  has No Known Allergies.  MEDICATIONS:  Current Outpatient Prescriptions  Medication Sig Dispense Refill  . ALPRAZolam (XANAX) 0.5 MG tablet Take 1 tablet (0.5 mg total) by mouth 3 (three) times daily as needed for anxiety. 90 tablet 3  . aspirin 81 MG tablet Take 81 mg by mouth daily.      Marland Kitchen Bioflavonoid Products (PERIDIN-C PO) Take 2 tablets by mouth 2 (two) times daily. Two tablets in the morning, one after lunch    . Cholecalciferol (VITAMIN D PO) Take 2,000 Units by mouth daily.     Marland Kitchen DIOVAN 160 MG tablet Take 160 mg by mouth daily.     . Multiple Vitamin (MULTIVITAMIN) capsule Take 1 capsule by mouth daily.      . polyethylene glycol powder (GLYCOLAX/MIRALAX) powder Take 1 Container by mouth as needed.     . tamoxifen (NOLVADEX) 20 MG tablet Take 1 tablet (20 mg total) by mouth daily. 90 tablet 4  . venlafaxine XR (EFFEXOR-XR) 75 MG 24 hr capsule TAKE 1 CAPSULE (75 MG TOTAL) BY MOUTH DAILY. 90 capsule 1   No current facility-administered medications for this visit.    PHYSICAL EXAMINATION: ECOG PERFORMANCE STATUS: 1 - Symptomatic but completely ambulatory  Filed Vitals:   06/17/15 1536  BP: 118/79  Pulse: 75  Temp: 97.7 F (36.5 C)  Resp: 18   Filed Weights   06/17/15 1536  Weight: 185 lb 4.8 oz (84.052 kg)    GENERAL:alert, no  distress and comfortable SKIN: skin color, texture, turgor are normal, no rashes or significant lesions EYES: normal, Conjunctiva are pink and non-injected, sclera clear OROPHARYNX:no exudate, no erythema and lips, buccal mucosa, and tongue normal  NECK: supple, thyroid normal size, non-tender, without nodularity LYMPH:  no palpable lymphadenopathy in the cervical, axillary  or inguinal LUNGS: clear to auscultation and percussion with normal breathing effort HEART: regular rate & rhythm and no murmurs and no lower extremity edema ABDOMEN:abdomen soft, non-tender and normal bowel sounds MUSCULOSKELETAL:no cyanosis of digits and no clubbing  NEURO: alert & oriented x 3 with fluent speech, no focal motor/sensory deficits EXTREMITIES: No lower extremity edema BREAST: No palpable masses or nodules in either right or left breasts. No palpable axillary supraclavicular or infraclavicular adenopathy no breast tenderness or nipple discharge. (exam performed in the presence of a chaperone)  LABORATORY DATA:  I have reviewed the data as listed   Chemistry      Component Value Date/Time   NA 140 12/10/2014 0832   NA 144 02/02/2012 1104   K 4.6 12/10/2014 0832   K 4.1 02/02/2012 1104   CL 106 11/05/2012 1419   CL 109 02/02/2012 1104   CO2 27 12/10/2014 0832   CO2 28 02/02/2012 1104   BUN 18.7 12/10/2014 0832   BUN 16 02/02/2012 1104   CREATININE 0.9 12/10/2014 0832   CREATININE 0.96 02/02/2012 1104      Component Value Date/Time   CALCIUM 9.0 12/10/2014 0832   CALCIUM 8.6 02/02/2012 1104   ALKPHOS 68 12/10/2014 0832   ALKPHOS 59 02/02/2012 1104   AST 17 12/10/2014 0832   AST 20 02/02/2012 1104   ALT 17 12/10/2014 0832   ALT 20 02/02/2012 1104   BILITOT 0.30 12/10/2014 0832   BILITOT 0.3 02/02/2012 1104       Lab Results  Component Value Date   WBC 6.3 12/10/2014   HGB 13.1 12/10/2014   HCT 38.4 12/10/2014   MCV 91.5 12/10/2014   PLT 220 12/10/2014   NEUTROABS 3.2 12/10/2014     ASSESSMENT & PLAN:  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   Tamoxifen toxicities: 1. Hot flashes being  treated with Effexor 75 mg daily. Tamoxifen will complete March 21st 2017  Surveillance: 1. Breast exam 06/17/15 normal 2. Mammogram 06/11/15 Normal  RTC once a year with survivorship NP.   No orders of the defined types were placed in this encounter.   The patient has a good understanding of the overall plan. she agrees with it. she will call with any problems that may develop before the next visit here.   Rulon Eisenmenger, MD 06/17/2015

## 2015-10-31 ENCOUNTER — Other Ambulatory Visit: Payer: Self-pay | Admitting: Hematology and Oncology

## 2015-11-13 ENCOUNTER — Other Ambulatory Visit: Payer: Self-pay | Admitting: Hematology and Oncology

## 2016-05-20 ENCOUNTER — Other Ambulatory Visit: Payer: Self-pay | Admitting: Family Medicine

## 2016-05-20 DIAGNOSIS — Z853 Personal history of malignant neoplasm of breast: Secondary | ICD-10-CM

## 2016-06-16 ENCOUNTER — Encounter: Payer: BC Managed Care – PPO | Admitting: Nurse Practitioner

## 2016-06-17 ENCOUNTER — Encounter: Payer: BC Managed Care – PPO | Admitting: Adult Health

## 2016-06-17 ENCOUNTER — Ambulatory Visit
Admission: RE | Admit: 2016-06-17 | Discharge: 2016-06-17 | Disposition: A | Discharge status: Home / Self Care | Payer: BC Managed Care – PPO | Source: Ambulatory Visit | Attending: Family Medicine | Admitting: Family Medicine

## 2016-06-17 DIAGNOSIS — Z853 Personal history of malignant neoplasm of breast: Secondary | ICD-10-CM

## 2016-07-11 ENCOUNTER — Encounter: Payer: BC Managed Care – PPO | Admitting: Adult Health

## 2016-07-18 ENCOUNTER — Ambulatory Visit (HOSPITAL_BASED_OUTPATIENT_CLINIC_OR_DEPARTMENT_OTHER): Payer: BC Managed Care – PPO | Admitting: Adult Health

## 2016-07-18 ENCOUNTER — Encounter: Payer: Self-pay | Admitting: Adult Health

## 2016-07-18 VITALS — BP 111/72 | HR 59 | Temp 97.7°F | Resp 18 | Ht 64.0 in | Wt 186.3 lb

## 2016-07-18 DIAGNOSIS — Z853 Personal history of malignant neoplasm of breast: Secondary | ICD-10-CM

## 2016-07-18 DIAGNOSIS — I10 Essential (primary) hypertension: Secondary | ICD-10-CM | POA: Diagnosis not present

## 2016-07-18 DIAGNOSIS — C50212 Malignant neoplasm of upper-inner quadrant of left female breast: Secondary | ICD-10-CM

## 2016-07-18 DIAGNOSIS — Z170 Estrogen receptor positive status [ER+]: Principal | ICD-10-CM

## 2016-07-18 NOTE — Progress Notes (Signed)
CLINIC:  Survivorship   REASON FOR VISIT:  Routine follow-up post-treatment for a recent history of breast cancer.  BRIEF ONCOLOGIC HISTORY:  Oncology History   12     Breast cancer of upper-inner quadrant of left female breast ([place])   05/10/2010 Mammogram    Calcifications in the left breast biopsy showed DCIS with calcifications and necrosis high-grade ER 72% PR 25%      05/31/2010 Surgery    Left breast lumpectomy: 1.2 cm intermediate grade DCIS ER 72% PR 25%       Procedure    NSABP B 43 clinical trial: Patient's DCIS was tested was HER-2 positive, randomized to observation only      06/28/2010 - 08/18/2010 Radiation Therapy    Adjuvant radiation therapy      09/08/2010 - 09/08/2015 Anti-estrogen oral therapy    Tamoxifen 20 mg daily       INTERVAL HISTORY:  Sara [name] presents to the [facility] today for our initial meeting to review her survivorship care plan detailing her treatment course for breast cancer, as well as monitoring long-term side effects of that treatment, education regarding health maintenance, screening, and overall wellness and health promotion.     Overall, Sara [name] reports feeling quite well since her visit here last year. She has not had any problems with her breasts, any new aches or pains, or any concerns since her last visit a year ago.      REVIEW OF SYSTEMS:  Breast: Denies any new nodularity, masses, tenderness, nipple changes, or nipple discharge.  A 14-point review of systems was completed and was negative, except as noted above.   ONCOLOGY TREATMENT TEAM:  1. Surgeon:  Dr. Margot Chimes at [facility] 2. Medical Oncologist: Dr. Lindi Adie 3. Radiation Oncologist: Dr. Pablo Ledger    PAST MEDICAL/SURGICAL HISTORY:  Past Medical History:  Diagnosis Date  . Breast cancer ([place])   . Hypertension    Past Surgical History:  Procedure Laterality Date  . BREAST EXCISIONAL BIOPSY  05/31/2010   Right   . BREAST LUMPECTOMY   05/31/2010   Left Dr Margot Chimes     ALLERGIES:  No Known Allergies   CURRENT MEDICATIONS:  Outpatient Encounter Prescriptions as of 07/18/2016  Medication Sig  . ALPRAZolam (XANAX) 0.5 MG tablet Take 1 tablet (0.5 mg total) by mouth 3 (three) times daily as needed for anxiety.  Marland Kitchen aspirin 81 MG tablet Take 81 mg by mouth daily.    . Cholecalciferol (VITAMIN D PO) Take 2,000 Units by mouth daily.   . citalopram (CELEXA) 40 MG tablet Take 40 mg by mouth daily.  Marland Kitchen DIOVAN 160 MG tablet Take 160 mg by mouth daily.   . Multiple Vitamin (MULTIVITAMIN) capsule Take 1 capsule by mouth daily.    . polyethylene glycol powder (GLYCOLAX/MIRALAX) powder Take 1 Container by mouth as needed.   . [DISCONTINUED] Bioflavonoid Products (PERIDIN-C PO) Take 2 tablets by mouth 2 (two) times daily. Two tablets in the morning, one after lunch  . [DISCONTINUED] tamoxifen (NOLVADEX) 20 MG tablet Take 1 tablet (20 mg total) by mouth daily.  . [DISCONTINUED] venlafaxine XR (EFFEXOR-XR) 75 MG 24 hr capsule TAKE 1 CAPSULE (75 MG TOTAL) BY MOUTH DAILY.   No facility-administered encounter medications on file as of 07/18/2016.      ONCOLOGIC FAMILY HISTORY:  Family History  Problem Relation Age of Onset  . Cancer Father      SOCIAL HISTORY:  Sara [name] is married and lives with her  husband in [place], [place].  She has 2 daughters who also live in [place], [place].  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  She denies any current or history of tobacco, alcohol, or illicit drug use.     PHYSICAL EXAMINATION:  Vital Signs:   Vitals:   07/18/16 1541  BP: 111/72  Pulse: (!) 59  Resp: 18  Temp: 97.7 F (36.5 C)   Filed Weights   07/18/16 1541  Weight: 186 lb 4.8 oz (84.5 kg)   General: Well-nourished, well-appearing female in no acute distress.  She is accompanied in clinic by her husband today.   HEENT: Head is normocephalic.  Pupils equal and reactive to  light. Conjunctivae clear without exudate.  Sclerae anicteric. Oral mucosa is pink, moist.  Oropharynx is pink without lesions or erythema.  Lymph: No cervical, supraclavicular, or infraclavicular lymphadenopathy noted on palpation.  Cardiovascular: Regular rate and rhythm.Marland Kitchen Respiratory: Clear to auscultation bilaterally. Chest expansion symmetric; breathing non-labored.  GI: Abdomen soft and round; non-tender, non-distended. Bowel sounds normoactive.  GU: Deferred.  Neuro: No focal deficits. Steady gait.  Psych: Mood and affect normal and appropriate for situation.  Extremities: No edema. Skin: Warm and dry. Breasts: left lumpectomy site without any nodularity or sign of recurrence, no nodules, masses, skin or nipple changes to either breast bilaterally  LABORATORY DATA:  None for this visit.  DIAGNOSTIC IMAGING:       ASSESSMENT AND PLAN:  Ms.. [name] is a pleasant 61 y.o. female with Stage 0 left breast DCIS, ER+/PR+/HER2+, diagnosed in 2012, treated with lumpectomy, adjuvant radiation therapy, and anti-estrogen therapy with Tamoxifen beginning in March, 2012.  She presents to the Survivorship Clinic for our initial meeting and routine follow-up post-completion of treatment for breast cancer.    1. Stage 0 left breast cancer:  Sara [name] is doing very well today.  She has no sign of recurrence.  She is on the B43 study and has been randomized to the observation only arm.  Today, a comprehensive survivorship care plan and treatment summary was reviewed with the patient today detailing her breast cancer diagnosis, treatment course, potential late/long-term effects of treatment, appropriate follow-up care with recommendations for the future, and patient education resources.  A copy of this summary, along with a letter will be sent to the patient's primary care provider via mail/fax/In Basket message after today's visit.  I ordered her next mammogram for her today, which is due 06/17/2017 at [facility] with [facility].    2. Bone health:  Given Ms. Caskey's age/history of breast cancer, she is at risk for bone demineralization.  Her last DEXA scan was 10/21/13, which showed a normal bone density.  She was encouraged to continue with her consumption of foods rich in calcium, as well as increase her weight-bearing activities.  She was given education on specific activities to promote bone health.  3. Cancer screening:  Due to Ms. Guajardo's history and her age, she should receive screening for skin cancers, colon cancer, and gynecologic cancers.  The information and recommendations are listed on the patient's comprehensive care plan/treatment summary and were reviewed in detail with the patient.  She is up to date with these screening.    4. Health maintenance and wellness promotion: Ms. Almanzar was encouraged to consume 5-7 servings of fruits and vegetables per day. We reviewed the "Nutrition Rainbow" handout, as well as the handout "Take Control of Your Health and Reduce Your Cancer  Risk" from the [employer].  She was also encouraged to engage in moderate to vigorous exercise for 30 minutes per day most days of the week. We discussed the [employer] fitness program, which is designed for cancer survivors to help them become more physically fit after cancer treatments.  She was instructed to limit her alcohol consumption and continue to abstain from tobacco use.     5. Support services/counseling: It is not uncommon for this period of the patient's cancer care trajectory to be one of many emotions and stressors.  We discussed an opportunity for her to participate in the next session of [facility] ("Finding Your New Normal") support group series designed for patients after they have completed treatment.   Ms. Culliver was encouraged to take advantage of our many other support services programs, support groups, and/or counseling in coping with her new life as a cancer survivor after  completing anti-cancer treatment.  She was offered support today through active listening and expressive supportive counseling.  She was given information regarding our available services and encouraged to contact me with any questions or for help enrolling in any of our support group/programs.    Dispo:   -Return to cancer center in one year for long term survivor surveillance.  A total of (45) minutes of face-to-face time was spent with this patient with greater than 50% of that time in counseling and care-coordination.   Charlestine Massed, NP [place] [phone]   Note: PRIMARY CARE PROVIDER Vidal Schwalbe, [place] [phone]

## 2017-05-18 ENCOUNTER — Other Ambulatory Visit: Payer: Self-pay | Admitting: Family Medicine

## 2017-05-18 DIAGNOSIS — Z9889 Other specified postprocedural states: Secondary | ICD-10-CM

## 2017-05-31 ENCOUNTER — Encounter: Payer: Self-pay | Admitting: Physician Assistant

## 2017-06-21 ENCOUNTER — Ambulatory Visit: Payer: BC Managed Care – PPO | Admitting: Physician Assistant

## 2017-06-22 ENCOUNTER — Ambulatory Visit: Payer: BC Managed Care – PPO | Admitting: Physician Assistant

## 2017-06-22 ENCOUNTER — Encounter: Payer: Self-pay | Admitting: Physician Assistant

## 2017-06-22 VITALS — BP 100/74 | HR 60 | Ht 64.0 in | Wt 190.2 lb

## 2017-06-22 DIAGNOSIS — R195 Other fecal abnormalities: Secondary | ICD-10-CM | POA: Diagnosis not present

## 2017-06-22 DIAGNOSIS — K219 Gastro-esophageal reflux disease without esophagitis: Secondary | ICD-10-CM

## 2017-06-22 DIAGNOSIS — K5900 Constipation, unspecified: Secondary | ICD-10-CM

## 2017-06-22 MED ORDER — ESOMEPRAZOLE MAGNESIUM 20 MG PO CPDR: 20.0000 mg | DELAYED_RELEASE_CAPSULE | Freq: Every day | ORAL | 3 refills | Status: DC

## 2017-06-22 MED ORDER — PEG-KCL-NACL-NASULF-NA ASC-C 140 G PO SOLR: 1.0000 | ORAL | 0 refills | Status: DC

## 2017-06-22 NOTE — Progress Notes (Signed)
Agree with assessment and plan as outlined.  

## 2017-06-22 NOTE — Patient Instructions (Signed)
We have sent the following medications to your pharmacy for you to pick up at your convenience:  Esomeprazole 20 mg daily 30-60 minutes before breakfast  You have been scheduled for a colonoscopy. Please follow written instructions given to you at your visit today.  Please pick up your prep supplies at the pharmacy within the next 1-3 days. If you use inhalers (even only as needed), please bring them with you on the day of your procedure. Your physician has requested that you go to www.startemmi.com and enter the access code given to you at your visit today. This web site gives a general overview about your procedure. However, you should still follow specific instructions given to you by our office regarding your preparation for the procedure.

## 2017-06-22 NOTE — Progress Notes (Addendum)
Chief Complaint: Positive I FOBT   HPI:    Sara [name] is a 62 year old female with a past medical history of breast cancer and hypertension, who was referred to me by Harlan Stains, MD for a complaint of positive iFOBT on 05/10/17 as well as reflux and constipation.     Today, the patient presents to clinic and explains that she was recently found to have a heme positive stool and told to follow-up with a gastroenterologist.  She tells me she has had several days of constipation recently while traveling but took some MiraLAX and this has gotten better.  This is not abnormal for her and she has had to do this in the past for occasional constipation symptoms.  She also describes one episode of rectal bleeding following 1 of these constipated bowel movements about 2 weeks ago.  She has seen no bleeding since then.    Patient also describes reflux for which she recently started over-the-counter Esomeprazole about 2 months ago, which is seeming to help.  She describes this as a "burning sensation in her esophagus".  Patient tells me she has had trouble with intermittent reflux before but most recently if she has tried to stop this medication she will have breakthrough reflux symptoms.  As long as she takes 1 tablet a day she does fine.    Patient recalls a colonoscopy around 8 years ago which was normal per her.    Patient denies fever, chills, weight loss, anorexia, nausea, vomiting, abdominal pain or symptoms that awaken her at night.  Past Medical History:  Diagnosis Date  . Breast cancer ([place])   . Hypertension     Past Surgical History:  Procedure Laterality Date  . BREAST EXCISIONAL BIOPSY  05/31/2010   Right   . BREAST LUMPECTOMY  05/31/2010   Left Dr Margot Chimes    Current Outpatient Medications  Medication Sig Dispense Refill  . ALPRAZolam (XANAX) 0.5 MG tablet Take 1 tablet (0.5 mg total) by mouth 3 (three) times daily as needed for anxiety. 90 tablet 3  . aspirin 81 MG tablet Take 81 mg  by mouth daily.      . Cholecalciferol (VITAMIN D PO) Take 2,000 Units by mouth daily.     . citalopram (CELEXA) 40 MG tablet Take 40 mg by mouth daily.    Marland Kitchen DIOVAN 160 MG tablet Take 160 mg by mouth daily.     Marland Kitchen esomeprazole (NEXIUM) 20 MG capsule Take 20 mg by mouth daily at 12 noon.    . Multiple Vitamin (MULTIVITAMIN) capsule Take 1 capsule by mouth daily.      . polyethylene glycol powder (GLYCOLAX/MIRALAX) powder Take 17 g by mouth as needed.     . cyclobenzaprine (FLEXERIL) 10 MG tablet Take 10 mg by mouth 3 (three) times daily as needed for muscle spasms.    . Pseudoephedrine HCl (SUDAFED PO) Take by mouth as needed.     No current facility-administered medications for this visit.     Allergies as of 06/22/2017  . (No Known Allergies)    Family History  Problem Relation Age of Onset  . Cancer Father     Social History   Socioeconomic History  . Marital status: Married    Spouse name: Not on file  . Number of children: Not on file  . Years of education: Not on file  . Highest education level: Not on file  Social Needs  . Financial resource strain: Not on file  . Food insecurity -  worry: Not on file  . Food insecurity - inability: Not on file  . Transportation needs - medical: Not on file  . Transportation needs - non-medical: Not on file  Occupational History  . Not on file  Tobacco Use  . Smoking status: Never Smoker  . Smokeless tobacco: Never Used  Substance and Sexual Activity  . Alcohol use: Yes    Comment: OCCASIONALLY  . Drug use: No  . Sexual activity: Yes  Other Topics Concern  . Not on file  Social History Narrative  . Not on file    Review of Systems:    Constitutional: No weight loss, fever or chills Skin: No rash  Cardiovascular: No chest pain Respiratory: No SOB  Gastrointestinal: See HPI and otherwise negative Genitourinary: No dysuria Neurological: No headache Musculoskeletal: No new muscle or joint pain Hematologic: No bleeding    Psychiatric: No history of depression or anxiety   Physical Exam:  Vital signs: BP 100/74   Pulse 60   Ht 5\' 4"  (1.626 m)   Wt 190 lb 3.2 oz (86.3 kg)   BMI 32.65 kg/m    Constitutional:   Pleasant overweight Caucasian female appears to be in NAD, Well developed, Well nourished, alert and cooperative Head:  Normocephalic and atraumatic. Eyes:   PEERL, EOMI. No icterus. Conjunctiva pink. Ears:  Normal auditory acuity. Neck:  Supple Throat: Oral cavity and pharynx without inflammation, swelling or lesion.  Respiratory: Respirations even and unlabored. Lungs clear to auscultation bilaterally.  Positive wheeze in b/l lung bases, No crackles, or rhonchi.  Cardiovascular: Normal S1, S2. No MRG. Regular rate and rhythm. No peripheral edema, cyanosis or pallor.  Gastrointestinal:  Soft, nondistended, nontender. No rebound or guarding. Normal bowel sounds. No appreciable masses or hepatomegaly. Rectal:  Not performed.  Msk:  Symmetrical without gross deformities. Without edema, no deformity or joint abnormality.  Neurologic:  Alert and  oriented x4;  grossly normal neurologically.  Skin:   Dry and intact without significant lesions or rashes. Psychiatric:  Demonstrates good judgement and reason without abnormal affect or behaviors.  See HPI. NO recent bloodwork.  Assessment: 1.  I FOBT positive stool: in November, last colo 8 years ago-with recent constipation, consider hemorrhoids most likely 2.  Constipation: resolved with Miralax prn 3.  GERD: resolved with Esomeprazole 20mg  qd  Plan: 1.  Scheduled patient for a colonoscopy for further evaluation with Dr. Havery Moros, as he is supervising today.  Did discuss risks, benefits, limitations and alternatives and the patient agrees to proceed. 2.  Encouraged the patient continue her MiraLAX for constipation prn.  Also discussed a high-fiber diet and increased water intake. 3.  Recommend the patient continue her Esomeprazole 20mg  qd. Provided  a 90 day prescription with 3 refills. Reviewed anti-reflux measures.  4. Requested records from [facility] re: last colonoscopy 5.  Patient to follow in clinic per recommendations from Dr. Havery Moros after time of procedure.   Ellouise Newer, PA-C [facility] 06/22/2017, 8:45 AM  Cc: Harlan Stains, MD

## 2017-06-23 ENCOUNTER — Ambulatory Visit
Admission: RE | Admit: 2017-06-23 | Discharge: 2017-06-23 | Disposition: A | Discharge status: Home / Self Care | Payer: BC Managed Care – PPO | Source: Ambulatory Visit | Attending: Family Medicine | Admitting: Family Medicine

## 2017-06-23 DIAGNOSIS — Z9889 Other specified postprocedural states: Secondary | ICD-10-CM

## 2017-06-30 ENCOUNTER — Other Ambulatory Visit: Payer: Self-pay

## 2017-06-30 ENCOUNTER — Ambulatory Visit (AMBULATORY_SURGERY_CENTER): Payer: BC Managed Care – PPO | Admitting: Gastroenterology

## 2017-06-30 ENCOUNTER — Encounter: Payer: Self-pay | Admitting: Gastroenterology

## 2017-06-30 VITALS — BP 108/76 | HR 64 | Temp 97.8°F | Resp 15 | Ht 64.0 in | Wt 190.0 lb

## 2017-06-30 DIAGNOSIS — K5520 Angiodysplasia of colon without hemorrhage: Secondary | ICD-10-CM

## 2017-06-30 DIAGNOSIS — R195 Other fecal abnormalities: Secondary | ICD-10-CM | POA: Diagnosis present

## 2017-06-30 DIAGNOSIS — K649 Unspecified hemorrhoids: Secondary | ICD-10-CM | POA: Diagnosis not present

## 2017-06-30 MED ORDER — SODIUM CHLORIDE 0.9 % IV SOLN: 500.0000 mL | Freq: Once | INTRAVENOUS | Status: AC

## 2017-06-30 NOTE — Patient Instructions (Signed)
Impression/Recommendations:  Hemorrhoid handout given to patient.  Resume previous diet. Continue present medications.  Repeat colonoscopy in 10 years for screening purposes.  YOU HAD AN ENDOSCOPIC PROCEDURE TODAY AT [facility]:   Refer to the procedure report that was given to you for any specific questions about what was found during the examination.  If the procedure report does not answer your questions, please call your gastroenterologist to clarify.  If you requested that your care partner not be given the details of your procedure findings, then the procedure report has been included in a sealed envelope for you to review at your convenience later.  YOU SHOULD EXPECT: Some feelings of bloating in the abdomen. Passage of more gas than usual.  Walking can help get rid of the air that was put into your GI tract during the procedure and reduce the bloating. If you had a lower endoscopy (such as a colonoscopy or flexible sigmoidoscopy) you may notice spotting of blood in your stool or on the toilet paper. If you underwent a bowel prep for your procedure, you may not have a normal bowel movement for a few days.  Please Note:  You might notice some irritation and congestion in your nose or some drainage.  This is from the oxygen used during your procedure.  There is no need for concern and it should clear up in a day or so.  SYMPTOMS TO REPORT IMMEDIATELY:   Following lower endoscopy (colonoscopy or flexible sigmoidoscopy):  Excessive amounts of blood in the stool  Significant tenderness or worsening of abdominal pains  Swelling of the abdomen that is new, acute  Fever of 100F or higher For urgent or emergent issues, a gastroenterologist can be reached at any hour by calling [phone].   DIET:  We do recommend a small meal at first, but then you may proceed to your regular diet.  Drink plenty of fluids but you should avoid alcoholic beverages for 24  hours.  ACTIVITY:  You should plan to take it easy for the rest of today and you should NOT DRIVE or use heavy machinery until tomorrow (because of the sedation medicines used during the test).    FOLLOW UP: Our staff will call the number listed on your records the next business day following your procedure to check on you and address any questions or concerns that you may have regarding the information given to you following your procedure. If we do not reach you, we will leave a message.  However, if you are feeling well and you are not experiencing any problems, there is no need to return our call.  We will assume that you have returned to your regular daily activities without incident.  If any biopsies were taken you will be contacted by phone or by letter within the next 1-3 weeks.  Please call us at [phone] if you have not heard about the biopsies in 3 weeks.    SIGNATURES/CONFIDENTIALITY: You and/or your care partner have signed paperwork which will be entered into your electronic medical record.  These signatures attest to the fact that that the information above on your After Visit Summary has been reviewed and is understood.  Full responsibility of the confidentiality of this discharge information lies with you and/or your care-partner.

## 2017-06-30 NOTE — Progress Notes (Signed)
To PACU, VSS. Report to [email or web address] 

## 2017-06-30 NOTE — Op Note (Signed)
[facility] Patient Name: Sara [name] Procedure Date: 06/30/2017 9:02 AM MRN: [id number] Endoscopist: Remo Lipps P. Armbruster MD, MD Age: 62 Referring MD:  Date of Birth: 12/19/1955 Gender: Female Account #: [id number] Procedure:                Colonoscopy Indications:              Positive fecal immunochemical test Medicines:                Monitored Anesthesia Care Procedure:                Pre-Anesthesia Assessment:                           - Prior to the procedure, a History and Physical                            was performed, and patient medications and                            allergies were reviewed. The patient's tolerance of                            previous anesthesia was also reviewed. The risks                            and benefits of the procedure and the sedation                            options and risks were discussed with the patient.                            All questions were answered, and informed consent                            was obtained. Prior Anticoagulants: The patient has                            taken no previous anticoagulant or antiplatelet                            agents. ASA Grade Assessment: II - A patient with                            mild systemic disease. After reviewing the risks                            and benefits, the patient was deemed in                            satisfactory condition to undergo the procedure.                           After obtaining informed consent, the colonoscope  was passed under direct vision. Throughout the                            procedure, the patient's blood pressure, pulse, and                            oxygen saturations were monitored continuously. The                            Colonoscope was introduced through the anus and                            advanced to the the terminal ileum, with                            identification of the  appendiceal orifice and IC                            valve. The colonoscopy was performed without                            difficulty. The patient tolerated the procedure                            well. The quality of the bowel preparation was                            good. The terminal ileum, ileocecal valve,                            appendiceal orifice, and rectum were photographed. Scope In: 9:14:27 AM Scope Out: 4:69:62 AM Scope Withdrawal Time: 0 hours 18 minutes 31 seconds  Total Procedure Duration: 0 hours 21 minutes 47 seconds  Findings:                 The perianal and digital rectal examinations were                            normal.                           The terminal ileum appeared normal.                           A single medium-sized angiodysplastic lesion                            without bleeding was found in the ascending colon.                           The colon was tortuous.                           Internal hemorrhoids and hypertrophied anal  papillae were found during retroflexion.                           The exam was otherwise without abnormality. Complications:            No immediate complications. Estimated blood loss:                            None. Estimated Blood Loss:     Estimated blood loss: none. Impression:               - The examined portion of the ileum was normal.                           - A single non-bleeding colonic angiodysplastic                            lesion.                           - Tortuous colon.                           - Internal hemorrhoids / Hypertophied anal papillae.                           - The examination was otherwise normal.                           - No specimens collected.                           Unclear if AVMs or hemorrhoid is causing stool test                            to be positive, no polyps. Recommendation:           - Patient has a contact number available  for                            emergencies. The signs and symptoms of potential                            delayed complications were discussed with the                            patient. Return to normal activities tomorrow.                            Written discharge instructions were provided to the                            patient.                           - Resume previous diet.                           -  Continue present medications.                           - Repeat colonoscopy in 10 years for screening                            purposes. Remo Lipps P. Armbruster MD, MD 06/30/2017 9:40:36 AM This report has been signed electronically.

## 2017-07-03 ENCOUNTER — Other Ambulatory Visit: Payer: Self-pay | Admitting: Family Medicine

## 2017-07-03 ENCOUNTER — Telehealth: Payer: Self-pay

## 2017-07-03 ENCOUNTER — Other Ambulatory Visit (HOSPITAL_COMMUNITY)
Admission: RE | Admit: 2017-07-03 | Discharge: 2017-07-03 | Disposition: A | Discharge status: Home / Self Care | Payer: BC Managed Care – PPO | Source: Ambulatory Visit | Attending: Family Medicine | Admitting: Family Medicine

## 2017-07-03 DIAGNOSIS — Z124 Encounter for screening for malignant neoplasm of cervix: Secondary | ICD-10-CM | POA: Insufficient documentation

## 2017-07-03 NOTE — Telephone Encounter (Signed)
  Follow up Call-  Call back number 06/30/2017  Post procedure Call Back phone  # [phone]  Permission to leave phone message Yes  Some recent data might be hidden     Patient questions:  Do you have a fever, pain , or abdominal swelling? No. Pain Score  0 *  Have you tolerated food without any problems? Yes.    Have you been able to return to your normal activities? Yes.    Do you have any questions about your discharge instructions: Diet   No. Medications  No. Follow up visit  No.  Do you have questions or concerns about your Care? No.  Actions: * If pain score is 4 or above: No action needed, pain <4.

## 2017-07-05 LAB — CYTOLOGY - PAP: DIAGNOSIS: NEGATIVE

## 2017-07-17 ENCOUNTER — Inpatient Hospital Stay: Payer: BC Managed Care – PPO | Attending: Adult Health | Admitting: Adult Health

## 2017-07-17 ENCOUNTER — Encounter: Payer: Self-pay | Admitting: Adult Health

## 2017-07-17 VITALS — BP 98/68 | HR 62 | Temp 97.4°F | Resp 18 | Ht 64.0 in | Wt 188.6 lb

## 2017-07-17 DIAGNOSIS — I10 Essential (primary) hypertension: Secondary | ICD-10-CM | POA: Diagnosis not present

## 2017-07-17 DIAGNOSIS — Z170 Estrogen receptor positive status [ER+]: Secondary | ICD-10-CM

## 2017-07-17 DIAGNOSIS — Z9223 Personal history of estrogen therapy: Secondary | ICD-10-CM | POA: Insufficient documentation

## 2017-07-17 DIAGNOSIS — Z923 Personal history of irradiation: Secondary | ICD-10-CM | POA: Diagnosis not present

## 2017-07-17 DIAGNOSIS — Z9221 Personal history of antineoplastic chemotherapy: Secondary | ICD-10-CM | POA: Diagnosis not present

## 2017-07-17 DIAGNOSIS — C50212 Malignant neoplasm of upper-inner quadrant of left female breast: Secondary | ICD-10-CM

## 2017-07-17 DIAGNOSIS — Z7982 Long term (current) use of aspirin: Secondary | ICD-10-CM | POA: Insufficient documentation

## 2017-07-17 DIAGNOSIS — Z853 Personal history of malignant neoplasm of breast: Secondary | ICD-10-CM | POA: Diagnosis not present

## 2017-07-17 DIAGNOSIS — Z79899 Other long term (current) drug therapy: Secondary | ICD-10-CM | POA: Insufficient documentation

## 2017-07-17 NOTE — Progress Notes (Signed)
CLINIC:  Survivorship   REASON FOR VISIT:  Routine follow-up for history of breast cancer.   BRIEF ONCOLOGIC HISTORY:  Oncology History   12     Breast cancer of upper-inner quadrant of left female breast ([facility])   05/10/2010 Mammogram    Calcifications in the left breast biopsy showed DCIS with calcifications and necrosis high-grade ER 72% PR 25%      05/31/2010 Surgery    Left breast lumpectomy: 1.2 cm intermediate grade DCIS ER 72% PR 25%       Procedure    NSABP B 43 clinical trial: Patient's DCIS was tested was HER-2 positive, randomized to observation only      06/28/2010 - 08/18/2010 Radiation Therapy    Adjuvant radiation therapy      09/08/2010 - 09/08/2015 Anti-estrogen oral therapy    Tamoxifen 20 mg daily        INTERVAL HISTORY:  Sara [name] presents to the [facility] today for routine follow-up for her history of breast cancer.  Overall, she reports feeling quite well. She is exercising twice a week, eating a well balanced diet.  She sees her PCP regularly and is up to date with her cancer screenings.  Her mammogram earlier this month was normal.  She is doing well today and denies any issues particularly with her breasts.      REVIEW OF SYSTEMS:  Review of Systems  Constitutional: Negative for appetite change, chills, fatigue, fever and unexpected weight change.  HENT:   Negative for hearing loss, lump/mass, mouth sores and trouble swallowing.   Eyes: Negative for eye problems and icterus.  Respiratory: Negative for chest tightness, cough and shortness of breath.   Cardiovascular: Negative for chest pain, leg swelling and palpitations.  Gastrointestinal: Negative for abdominal distention and abdominal pain.  Endocrine: Negative for hot flashes.  Genitourinary: Negative for difficulty urinating.   Musculoskeletal: Negative for arthralgias.  Skin: Negative for itching and rash.  Neurological: Negative for dizziness, extremity weakness, headaches  and numbness.  Hematological: Negative for adenopathy. Does not bruise/bleed easily.  Psychiatric/Behavioral: Negative for depression. The patient is not nervous/anxious.   Breast: Denies any new nodularity, masses, tenderness, nipple changes, or nipple discharge.       PAST MEDICAL/SURGICAL HISTORY:  Past Medical History:  Diagnosis Date  . Breast cancer ([facility])   . Hypertension    Past Surgical History:  Procedure Laterality Date  . BREAST EXCISIONAL BIOPSY  05/31/2010   Right   . BREAST LUMPECTOMY  05/31/2010   Left Dr Margot Chimes     ALLERGIES:  No Known Allergies   CURRENT MEDICATIONS:  Outpatient Encounter Medications as of 07/17/2017  Medication Sig  . ALPRAZolam (XANAX) 0.5 MG tablet Take 1 tablet (0.5 mg total) by mouth 3 (three) times daily as needed for anxiety.  Marland Kitchen aspirin 81 MG tablet Take 81 mg by mouth daily.    . Cholecalciferol (VITAMIN D PO) Take 2,000 Units by mouth daily.   . citalopram (CELEXA) 40 MG tablet Take 40 mg by mouth daily.  . cyclobenzaprine (FLEXERIL) 10 MG tablet Take 10 mg by mouth 3 (three) times daily as needed for muscle spasms.  Marland Kitchen DIOVAN 160 MG tablet Take 160 mg by mouth daily.   Marland Kitchen esomeprazole (NEXIUM) 20 MG capsule Take 1 capsule (20 mg total) by mouth daily at 12 noon.  . Multiple Vitamin (MULTIVITAMIN) capsule Take 1 capsule by mouth daily.    . polyethylene glycol powder (GLYCOLAX/MIRALAX) powder Take 17 g by mouth  as needed.   . Pseudoephedrine HCl (SUDAFED PO) Take by mouth as needed.   Facility-Administered Encounter Medications as of 07/17/2017  Medication  . 0.9 %  sodium chloride infusion     ONCOLOGIC FAMILY HISTORY:  Family History  Problem Relation Age of Onset  . Stroke Mother   . Brain cancer Father   . Leukemia Brother   . Breast cancer Maternal Aunt   . Colon cancer Neg Hx   . Esophageal cancer Neg Hx   . Pancreatic cancer Neg Hx   . Stomach cancer Neg Hx   . Liver disease Neg Hx     GENETIC  COUNSELING/TESTING: Not at this time  SOCIAL HISTORY:  Sara [name] is married and lives at home with her husband in [place], [place].  She has 2 children and they live in [place] and [place].  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  She denies any current or history of tobacco, alcohol, or illicit drug use.     PHYSICAL EXAMINATION:  Vital Signs: Vitals:   07/17/17 1510  BP: 98/68  Pulse: 62  Resp: 18  Temp: (!) 97.4 F (36.3 C)  SpO2: 97%   Filed Weights   07/17/17 1510  Weight: 188 lb 9.6 oz (85.5 kg)   General: Well-nourished, well-appearing female in no acute distress.  Unaccompanied today.   HEENT: Head is normocephalic.  Pupils equal and reactive to light. Conjunctivae clear without exudate.  Sclerae anicteric. Oral mucosa is pink, moist.  Oropharynx is pink without lesions or erythema.  Lymph: No cervical, supraclavicular, or infraclavicular lymphadenopathy noted on palpation.  Cardiovascular: Regular rate and rhythm.Marland Kitchen Respiratory: Clear to auscultation bilaterally. Chest expansion symmetric; breathing non-labored.  Breast Exam:  -Left breast: No appreciable masses on palpation. No skin redness, thickening, or peau d'orange appearance; no nipple retraction or nipple discharge; mild distortion in symmetry at previous lumpectomy site well healed scar without erythema or nodularity.  -Right breast: No appreciable masses on palpation. No skin redness, thickening, or peau d'orange appearance; no nipple retraction or nipple discharge;. -Axilla: No axillary adenopathy bilaterally.  GI: Abdomen soft and round; non-tender, non-distended. Bowel sounds normoactive. No hepatosplenomegaly.   GU: Deferred.  Neuro: No focal deficits. Steady gait.  Psych: Mood and affect normal and appropriate for situation.  MSK: No focal spinal tenderness to palpation, full range of motion in bilateral upper  extremities Extremities: No edema. Skin: Warm and dry.  LABORATORY DATA:  None for this visit   DIAGNOSTIC IMAGING:  Most recent mammogram:      ASSESSMENT AND PLAN:  Ms.. [name] is a pleasant 62 y.o. 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 She [facility] for surveillance and routine follow-up.   1. History of breast cancer:  Sara [name] is currently clinically and radiographically without evidence of disease or recurrence of breast cancer. She will be due for mammogram in 06/2018.    Since Sara [name] has completed her Tamoxifen and is now more than 8 years out from her DCIS diagnosis, I encouraged her to resume annual f/u with her PCP and see [place] on an as needed basis.  She is comfortable with and in agreement with this plan.    2. Cancer screening:  Due to Sara [name]'s history and her age, she should receive  screening for skin cancers, colon cancer, and gynecologic cancers. She was encouraged to follow-up with her PCP for appropriate cancer screenings.   3. Health maintenance and wellness promotion: Sara [name] was encouraged to consume 5-7 servings of fruits and vegetables per day. She was also encouraged to engage in moderate to vigorous exercise for 30 minutes per day most days of the week. She was instructed to limit her alcohol consumption and continue to abstain from tobacco use.  Dispo:  -Return to cancer center PRN   A total of (30) minutes of face-to-face time was spent with this patient with greater than 50% of that time in counseling and care-coordination.   Gardenia Phlegm, NP [facility] [phone]   Note: PRIMARY CARE PROVIDER Harlan Stains, [place] [phone]

## 2017-07-18 ENCOUNTER — Telehealth: Payer: Self-pay | Admitting: Adult Health

## 2017-07-18 NOTE — Telephone Encounter (Signed)
No 1/28 los.  °

## 2018-05-23 ENCOUNTER — Other Ambulatory Visit: Payer: Self-pay | Admitting: Family Medicine

## 2018-05-23 DIAGNOSIS — Z1231 Encounter for screening mammogram for malignant neoplasm of breast: Secondary | ICD-10-CM

## 2018-05-31 ENCOUNTER — Other Ambulatory Visit: Payer: Self-pay | Admitting: Physician Assistant

## 2018-06-29 ENCOUNTER — Ambulatory Visit
Admission: RE | Admit: 2018-06-29 | Discharge: 2018-06-29 | Disposition: A | Discharge status: Home / Self Care | Payer: BC Managed Care – PPO | Source: Ambulatory Visit | Attending: Family Medicine | Admitting: Family Medicine

## 2018-06-29 DIAGNOSIS — Z1231 Encounter for screening mammogram for malignant neoplasm of breast: Secondary | ICD-10-CM

## 2018-06-29 HISTORY — DX: Personal history of irradiation: Z92.3

## 2019-06-14 ENCOUNTER — Other Ambulatory Visit: Payer: Self-pay | Admitting: Physician Assistant

## 2019-07-10 ENCOUNTER — Other Ambulatory Visit: Payer: Self-pay | Admitting: Family Medicine

## 2019-07-10 ENCOUNTER — Other Ambulatory Visit: Payer: Self-pay | Admitting: Physician Assistant

## 2019-07-10 DIAGNOSIS — Z1231 Encounter for screening mammogram for malignant neoplasm of breast: Secondary | ICD-10-CM

## 2019-07-16 ENCOUNTER — Other Ambulatory Visit: Payer: Self-pay | Admitting: Family Medicine

## 2019-07-16 DIAGNOSIS — E2839 Other primary ovarian failure: Secondary | ICD-10-CM

## 2019-07-31 ENCOUNTER — Other Ambulatory Visit: Payer: BC Managed Care – PPO

## 2019-08-14 ENCOUNTER — Other Ambulatory Visit: Payer: Self-pay

## 2019-08-14 ENCOUNTER — Ambulatory Visit
Admission: RE | Admit: 2019-08-14 | Discharge: 2019-08-14 | Disposition: A | Discharge status: Home / Self Care | Payer: BC Managed Care – PPO | Source: Ambulatory Visit | Attending: Family Medicine | Admitting: Family Medicine

## 2019-08-14 DIAGNOSIS — Z1231 Encounter for screening mammogram for malignant neoplasm of breast: Secondary | ICD-10-CM

## 2019-10-07 ENCOUNTER — Other Ambulatory Visit: Payer: Self-pay

## 2019-10-07 ENCOUNTER — Ambulatory Visit
Admission: RE | Admit: 2019-10-07 | Discharge: 2019-10-07 | Disposition: A | Discharge status: Home / Self Care | Payer: BC Managed Care – PPO | Source: Ambulatory Visit | Attending: Family Medicine | Admitting: Family Medicine

## 2019-10-07 DIAGNOSIS — E2839 Other primary ovarian failure: Secondary | ICD-10-CM

## 2019-10-18 ENCOUNTER — Other Ambulatory Visit: Payer: Self-pay | Admitting: Physician Assistant

## 2019-10-30 ENCOUNTER — Other Ambulatory Visit: Payer: Self-pay | Admitting: Physician Assistant

## 2020-04-23 ENCOUNTER — Other Ambulatory Visit: Payer: Self-pay | Admitting: Physician Assistant

## 2020-07-16 ENCOUNTER — Other Ambulatory Visit (HOSPITAL_COMMUNITY)
Admission: RE | Admit: 2020-07-16 | Discharge: 2020-07-16 | Disposition: A | Discharge status: Home / Self Care | Payer: BC Managed Care – PPO | Source: Ambulatory Visit | Attending: Family Medicine | Admitting: Family Medicine

## 2020-07-16 ENCOUNTER — Other Ambulatory Visit: Payer: Self-pay | Admitting: Family Medicine

## 2020-07-16 DIAGNOSIS — Z124 Encounter for screening for malignant neoplasm of cervix: Secondary | ICD-10-CM | POA: Diagnosis not present

## 2020-07-20 LAB — CYTOLOGY - PAP
Comment: NEGATIVE
Diagnosis: NEGATIVE
High risk HPV: NEGATIVE

## 2020-09-01 ENCOUNTER — Other Ambulatory Visit: Payer: Self-pay | Admitting: Family Medicine

## 2020-09-01 DIAGNOSIS — Z1231 Encounter for screening mammogram for malignant neoplasm of breast: Secondary | ICD-10-CM

## 2020-10-22 ENCOUNTER — Other Ambulatory Visit: Payer: Self-pay

## 2020-10-22 ENCOUNTER — Ambulatory Visit
Admission: RE | Admit: 2020-10-22 | Discharge: 2020-10-22 | Disposition: A | Discharge status: Home / Self Care | Payer: BC Managed Care – PPO | Source: Ambulatory Visit | Attending: Family Medicine | Admitting: Family Medicine

## 2020-10-22 DIAGNOSIS — Z1231 Encounter for screening mammogram for malignant neoplasm of breast: Secondary | ICD-10-CM

## 2021-09-27 ENCOUNTER — Other Ambulatory Visit: Payer: Self-pay | Admitting: Family Medicine

## 2021-09-27 DIAGNOSIS — Z1231 Encounter for screening mammogram for malignant neoplasm of breast: Secondary | ICD-10-CM

## 2021-11-10 ENCOUNTER — Ambulatory Visit: Payer: BC Managed Care – PPO

## 2021-11-10 ENCOUNTER — Ambulatory Visit
Admission: RE | Admit: 2021-11-10 | Discharge: 2021-11-10 | Disposition: A | Discharge status: Home / Self Care | Payer: BC Managed Care – PPO | Source: Ambulatory Visit | Attending: Family Medicine | Admitting: Family Medicine

## 2021-11-10 DIAGNOSIS — Z1231 Encounter for screening mammogram for malignant neoplasm of breast: Secondary | ICD-10-CM

## 2022-07-28 ENCOUNTER — Other Ambulatory Visit: Payer: Self-pay | Admitting: Family Medicine

## 2022-07-28 DIAGNOSIS — M8580 Other specified disorders of bone density and structure, unspecified site: Secondary | ICD-10-CM

## 2022-08-09 ENCOUNTER — Other Ambulatory Visit: Payer: Self-pay | Admitting: Family Medicine

## 2022-08-09 DIAGNOSIS — Z1231 Encounter for screening mammogram for malignant neoplasm of breast: Secondary | ICD-10-CM

## 2022-11-21 ENCOUNTER — Ambulatory Visit
Admission: RE | Admit: 2022-11-21 | Discharge: 2022-11-21 | Disposition: A | Discharge status: Home / Self Care | Payer: BC Managed Care – PPO | Source: Ambulatory Visit | Attending: Family Medicine | Admitting: Family Medicine

## 2022-11-21 DIAGNOSIS — Z1231 Encounter for screening mammogram for malignant neoplasm of breast: Secondary | ICD-10-CM

## 2022-11-23 ENCOUNTER — Other Ambulatory Visit: Payer: Self-pay | Admitting: Family Medicine

## 2022-11-23 DIAGNOSIS — R928 Other abnormal and inconclusive findings on diagnostic imaging of breast: Secondary | ICD-10-CM

## 2022-11-24 ENCOUNTER — Other Ambulatory Visit: Payer: Self-pay | Admitting: Podiatry

## 2022-11-24 ENCOUNTER — Ambulatory Visit: Payer: BC Managed Care – PPO | Admitting: Podiatry

## 2022-11-24 ENCOUNTER — Ambulatory Visit: Payer: BC Managed Care – PPO

## 2022-11-24 DIAGNOSIS — B351 Tinea unguium: Secondary | ICD-10-CM | POA: Diagnosis not present

## 2022-11-24 DIAGNOSIS — M722 Plantar fascial fibromatosis: Secondary | ICD-10-CM | POA: Diagnosis not present

## 2022-11-24 DIAGNOSIS — M79672 Pain in left foot: Secondary | ICD-10-CM

## 2022-11-24 DIAGNOSIS — M7731 Calcaneal spur, right foot: Secondary | ICD-10-CM

## 2022-11-24 MED ORDER — METHYLPREDNISOLONE 4 MG PO TBPK: ORAL_TABLET | ORAL | 0 refills | Status: AC

## 2022-11-24 MED ORDER — EFINACONAZOLE 10 % EX SOLN: 1.0000 [drp] | Freq: Every day | CUTANEOUS | 11 refills | Status: AC

## 2022-11-24 NOTE — Progress Notes (Signed)
Subjective:   Patient ID: Sara [name], female   DOB: 67 y.o.   MRN: [id number]   HPI Chief Complaint  Patient presents with   Foot Pain    Right heel pain    67 year old female presents the office with above concerns.  She states that has been ongoing for the past couple months, intermittent pain.  She states that it gets real sore in the morning and some days it is not as bad but this past Tuesday more sore. No swelling no injury/treamtent.  Other than tried home stretching. On ibuprofen regularly.  Also has questions of her left hallux toenail, no treamtment. It is  yellow  and thick .  No pain.   Review of Systems  All other systems reviewed and are negative.  Past Medical History:  Diagnosis Date   Breast cancer ([facility])    Hypertension    Personal history of radiation therapy     Past Surgical History:  Procedure Laterality Date   BREAST EXCISIONAL BIOPSY Right 05/31/2010   BREAST LUMPECTOMY Left 05/31/2010   Left Dr Jamey Ripa     Current Outpatient Medications:    Efinaconazole 10 % SOLN, Apply 1 drop topically daily., Disp: 4 mL, Rfl: 11   methylPREDNISolone (MEDROL DOSEPAK) 4 MG TBPK tablet, Take as directed, Disp: 21 tablet, Rfl: 0   ALPRAZolam (XANAX) 0.5 MG tablet, Take 1 tablet (0.5 mg total) by mouth 3 (three) times daily as needed for anxiety., Disp: 90 tablet, Rfl: 3   aspirin 81 MG tablet, Take 81 mg by mouth daily.  , Disp: , Rfl:    Cholecalciferol (VITAMIN D PO), Take 2,000 Units by mouth daily. , Disp: , Rfl:    citalopram (CELEXA) 40 MG tablet, Take 40 mg by mouth daily., Disp: , Rfl:    cyclobenzaprine (FLEXERIL) 10 MG tablet, Take 10 mg by mouth 3 (three) times daily as needed for muscle spasms., Disp: , Rfl:    DIOVAN 160 MG tablet, Take 160 mg by mouth daily. , Disp: , Rfl:    esomeprazole (NEXIUM) 20 MG capsule, Take 1 capsule (20 mg total) by mouth daily before breakfast. Please schedule a yearly follow up visit for further refills., Disp: 30 capsule,  Rfl: 1   Multiple Vitamin (MULTIVITAMIN) capsule, Take 1 capsule by mouth daily.  , Disp: , Rfl:    polyethylene glycol powder (GLYCOLAX/MIRALAX) powder, Take 17 g by mouth as needed. , Disp: , Rfl:    Pseudoephedrine HCl (SUDAFED PO), Take by mouth as needed., Disp: , Rfl:   Current Facility-Administered Medications:    0.9 %  sodium chloride infusion, 500 mL, Intravenous, Once, Armbruster, Willaim Rayas, MD  No Known Allergies        Objective:  Physical Exam  General: AAO x3, NAD  Dermatological: Left hallux is hypertrophic, dystrophic with yellow discoloration.  No edema, erythema or signs of infection.  No open lesions.  Vascular: Dorsalis Pedis artery and Posterior Tibial artery pedal pulses are 2/4 bilateral with immedate capillary fill time.  There is no pain with calf compression, swelling, warmth, erythema.   Neruologic: Grossly intact via light touch bilateral.   Musculoskeletal: There is tenderness palpation along the plantar aspect of the right foot along the insertion of plantar fascia of the heel but also going to the arch of the foot along the medial band of the plantar fascia.  Clinically the plantar fascia is intact.  No edema, erythema.  Tenderness to the Achilles tendon and it is  intact.  No other areas of discomfort, no areas of pinpoint tenderness.  MMT 5/5.  Gait: Unassisted, Nonantalgic.   Assessment:   67 year old female with plantar fasciitis right foot; left onychomycosis     Plan:  -Treatment options discussed including all alternatives, risks, and complications -Etiology of symptoms were discussed -X-rays were obtained and reviewed with the patient.  3 views of the right foot were obtained.  No evidence of acute fracture.  Minimal calcaneal spurring is present. (Of note the order is for the left foot but it was the right foot that had the x-ray). -Discussed steroid injection.  Medrol Dosepak prescribed. -Plantar fascia brace dispensed to help support the  plantar fascia during the day -Night splint dispensed. Static or dynamic ankle foot orthosis, including soft interface material, adjustable for fit, for positioning, may be used for minimal ambulation, prefabricated, off-the-shelf was dispensed on the billed date of service  -Discussed stretching, icing daily as well as good shoes and arch supports. -If no improvement consider steroid injection  -Discussed treatment options for nail fungus and success rates/side affects.  Prescribed Jublia.    Vivi Barrack DPM

## 2022-11-24 NOTE — Patient Instructions (Signed)
If was nice to meet you today. If you have any questions or any further concerns, please feel fee to give me a call. You can call our office at [phone] or please feel fee to send me a message through MyChart.    For instructions on how to put on your Night Splint, please visit [email or web address]   Plantar Fasciitis (Heel Spur Syndrome) with Rehab The plantar fascia is a fibrous, ligament-like, soft-tissue structure that spans the bottom of the foot. Plantar fasciitis is a condition that causes pain in the foot due to inflammation of the tissue. SYMPTOMS   Pain and tenderness on the underneath side of the foot.  Pain that worsens with standing or walking. CAUSES  Plantar fasciitis is caused by irritation and injury to the plantar fascia on the underneath side of the foot. Common mechanisms of injury include:  Direct trauma to bottom of the foot.  Damage to a small nerve that runs under the foot where the main fascia attaches to the heel bone.  Stress placed on the plantar fascia due to bone spurs. RISK INCREASES WITH:   Activities that place stress on the plantar fascia (running, jumping, pivoting, or cutting).  Poor strength and flexibility.  Improperly fitted shoes.  Tight calf muscles.  Flat feet.  Failure to warm-up properly before activity.  Obesity. PREVENTION  Warm up and stretch properly before activity.  Allow for adequate recovery between workouts.  Maintain physical fitness:  Strength, flexibility, and endurance.  Cardiovascular fitness.  Maintain a health body weight.  Avoid stress on the plantar fascia.  Wear properly fitted shoes, including arch supports for individuals who have flat feet.  PROGNOSIS  If treated properly, then the symptoms of plantar fasciitis usually resolve without surgery. However, occasionally surgery is necessary.  RELATED COMPLICATIONS   Recurrent symptoms that may result in a chronic condition.  Problems of  the lower back that are caused by compensating for the injury, such as limping.  Pain or weakness of the foot during push-off following surgery.  Chronic inflammation, scarring, and partial or complete fascia tear, occurring more often from repeated injections.  TREATMENT  Treatment initially involves the use of ice and medication to help reduce pain and inflammation. The use of strengthening and stretching exercises may help reduce pain with activity, especially stretches of the Achilles tendon. These exercises may be performed at home or with a therapist. Your caregiver may recommend that you use heel cups of arch supports to help reduce stress on the plantar fascia. Occasionally, corticosteroid injections are given to reduce inflammation. If symptoms persist for greater than 6 months despite non-surgical (conservative), then surgery may be recommended.   MEDICATION   If pain medication is necessary, then nonsteroidal anti-inflammatory medications, such as aspirin and ibuprofen, or other minor pain relievers, such as acetaminophen, are often recommended.  Do not take pain medication within 7 days before surgery.  Prescription pain relievers may be given if deemed necessary by your caregiver. Use only as directed and only as much as you need.  Corticosteroid injections may be given by your caregiver. These injections should be reserved for the most serious cases, because they may only be given a certain number of times.  HEAT AND COLD  Cold treatment (icing) relieves pain and reduces inflammation. Cold treatment should be applied for 10 to 15 minutes every 2 to 3 hours for inflammation and pain and immediately after any activity that aggravates your symptoms. Use ice packs or massage the area with   a piece of ice (ice massage).  Heat treatment may be used prior to performing the stretching and strengthening activities prescribed by your caregiver, physical therapist, or athletic trainer. Use a  heat pack or soak the injury in warm water.  SEEK IMMEDIATE MEDICAL CARE IF:  Treatment seems to offer no benefit, or the condition worsens.  Any medications produce adverse side effects.  EXERCISES- RANGE OF MOTION (ROM) AND STRETCHING EXERCISES - Plantar Fasciitis (Heel Spur Syndrome) These exercises may help you when beginning to rehabilitate your injury. Your symptoms may resolve with or without further involvement from your physician, physical therapist or athletic trainer. While completing these exercises, remember:   Restoring tissue flexibility helps normal motion to return to the joints. This allows healthier, less painful movement and activity.  An effective stretch should be held for at least 30 seconds.  A stretch should never be painful. You should only feel a gentle lengthening or release in the stretched tissue.  RANGE OF MOTION - Toe Extension, Flexion  Sit with your right / left leg crossed over your opposite knee.  Grasp your toes and gently pull them back toward the top of your foot. You should feel a stretch on the bottom of your toes and/or foot.  Hold this stretch for 10 seconds.  Now, gently pull your toes toward the bottom of your foot. You should feel a stretch on the top of your toes and or foot.  Hold this stretch for 10 seconds. Repeat  times. Complete this stretch 3 times per day.   RANGE OF MOTION - Ankle Dorsiflexion, Active Assisted  Remove shoes and sit on a chair that is preferably not on a carpeted surface.  Place right / left foot under knee. Extend your opposite leg for support.  Keeping your heel down, slide your right / left foot back toward the chair until you feel a stretch at your ankle or calf. If you do not feel a stretch, slide your bottom forward to the edge of the chair, while still keeping your heel down.  Hold this stretch for 10 seconds. Repeat 3 times. Complete this stretch 2 times per day.   STRETCH  Gastroc,  Standing  Place hands on wall.  Extend right / left leg, keeping the front knee somewhat bent.  Slightly point your toes inward on your back foot.  Keeping your right / left heel on the floor and your knee straight, shift your weight toward the wall, not allowing your back to arch.  You should feel a gentle stretch in the right / left calf. Hold this position for 10 seconds. Repeat 3 times. Complete this stretch 2 times per day.  STRETCH  Soleus, Standing  Place hands on wall.  Extend right / left leg, keeping the other knee somewhat bent.  Slightly point your toes inward on your back foot.  Keep your right / left heel on the floor, bend your back knee, and slightly shift your weight over the back leg so that you feel a gentle stretch deep in your back calf.  Hold this position for 10 seconds. Repeat 3 times. Complete this stretch 2 times per day.  STRETCH  Gastrocsoleus, Standing  Note: This exercise can place a lot of stress on your foot and ankle. Please complete this exercise only if specifically instructed by your caregiver.   Place the ball of your right / left foot on a step, keeping your other foot firmly on the same step.  Hold on   to the wall or a rail for balance.  Slowly lift your other foot, allowing your body weight to press your heel down over the edge of the step.  You should feel a stretch in your right / left calf.  Hold this position for 10 seconds.  Repeat this exercise with a slight bend in your right / left knee. Repeat 3 times. Complete this stretch 2 times per day.   STRENGTHENING EXERCISES - Plantar Fasciitis (Heel Spur Syndrome)  These exercises may help you when beginning to rehabilitate your injury. They may resolve your symptoms with or without further involvement from your physician, physical therapist or athletic trainer. While completing these exercises, remember:   Muscles can gain both the endurance and the strength needed for everyday  activities through controlled exercises.  Complete these exercises as instructed by your physician, physical therapist or athletic trainer. Progress the resistance and repetitions only as guided.  STRENGTH - Towel Curls  Sit in a chair positioned on a non-carpeted surface.  Place your foot on a towel, keeping your heel on the floor.  Pull the towel toward your heel by only curling your toes. Keep your heel on the floor. Repeat 3 times. Complete this exercise 2 times per day.  STRENGTH - Ankle Inversion  Secure one end of a rubber exercise band/tubing to a fixed object (table, pole). Loop the other end around your foot just before your toes.  Place your fists between your knees. This will focus your strengthening at your ankle.  Slowly, pull your big toe up and in, making sure the band/tubing is positioned to resist the entire motion.  Hold this position for 10 seconds.  Have your muscles resist the band/tubing as it slowly pulls your foot back to the starting position. Repeat 3 times. Complete this exercises 2 times per day.  Document Released: 06/06/2005 Document Revised: 08/29/2011 Document Reviewed: 09/18/2008 [employer] Patient Information 2014 [employer].  

## 2022-11-29 ENCOUNTER — Ambulatory Visit
Admission: RE | Admit: 2022-11-29 | Discharge: 2022-11-29 | Disposition: A | Discharge status: Home / Self Care | Payer: BC Managed Care – PPO | Source: Ambulatory Visit | Attending: Family Medicine | Admitting: Family Medicine

## 2022-11-29 DIAGNOSIS — R928 Other abnormal and inconclusive findings on diagnostic imaging of breast: Secondary | ICD-10-CM

## 2023-01-02 ENCOUNTER — Ambulatory Visit: Payer: BC Managed Care – PPO | Admitting: Podiatry

## 2023-01-09 ENCOUNTER — Ambulatory Visit
Admission: RE | Admit: 2023-01-09 | Discharge: 2023-01-09 | Disposition: A | Discharge status: Home / Self Care | Payer: BC Managed Care – PPO | Source: Ambulatory Visit | Attending: Family Medicine | Admitting: Family Medicine

## 2023-01-09 DIAGNOSIS — M8580 Other specified disorders of bone density and structure, unspecified site: Secondary | ICD-10-CM

## 2023-09-05 IMAGING — MG MM DIGITAL SCREENING BILAT W/ TOMO AND CAD
8 series · 8 of 24 positions shown · non-contrast
Comparison: Previous exam(s).

CLINICAL DATA: Screening.

EXAM:
DIGITAL SCREENING BILATERAL MAMMOGRAM WITH TOMOSYNTHESIS AND CAD
TECHNIQUE: Bilateral screening digital craniocaudal and mediolateral oblique
mammograms were obtained. Bilateral screening digital breast
tomosynthesis was performed. The images were evaluated with
computer-aided detection.

[L MLO synth-2D]
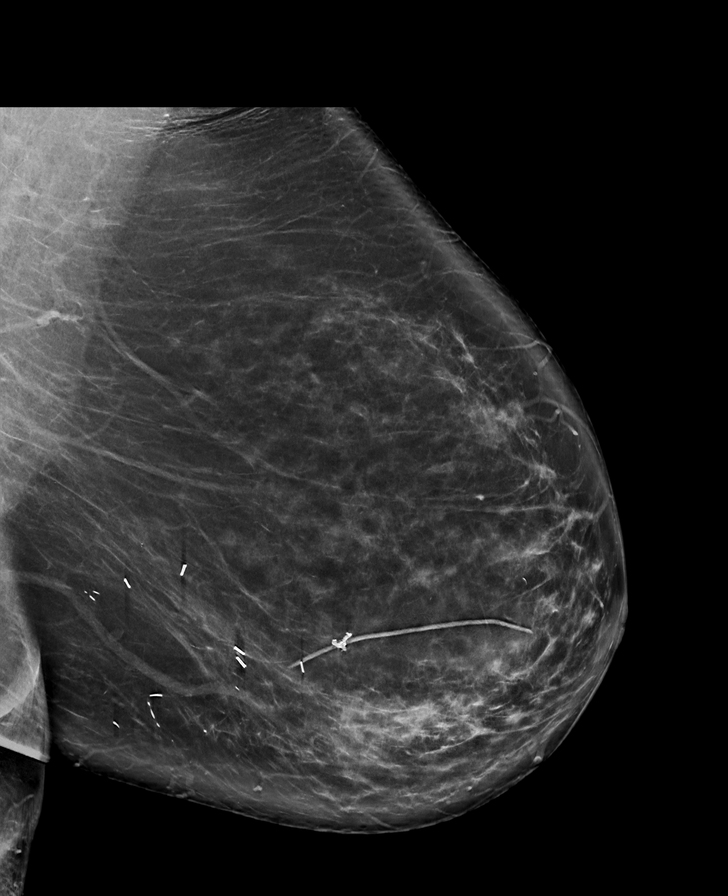

[R MLO synth-2D]
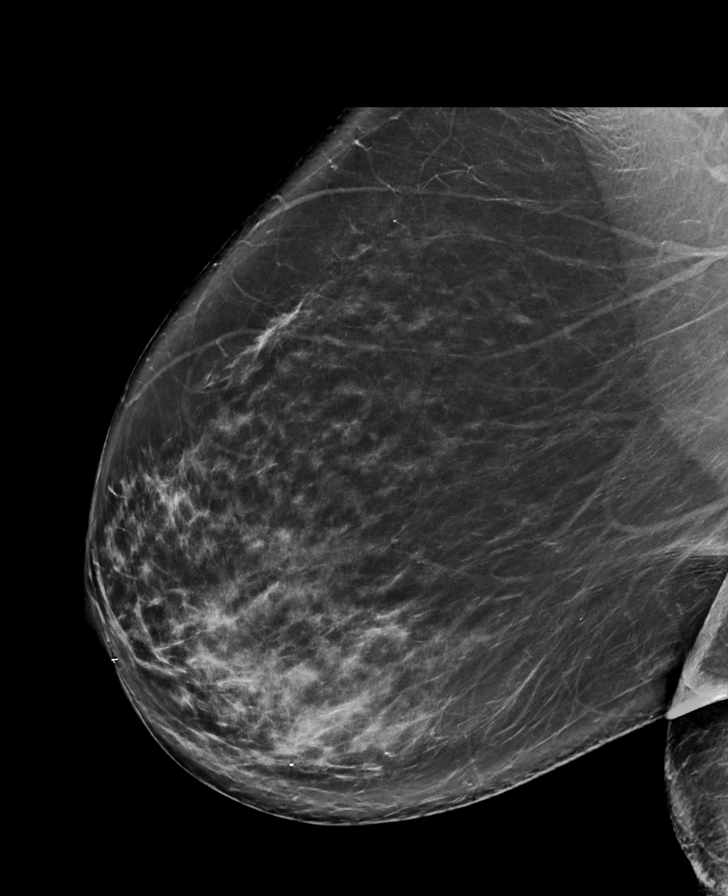

[L CC synth-2D]
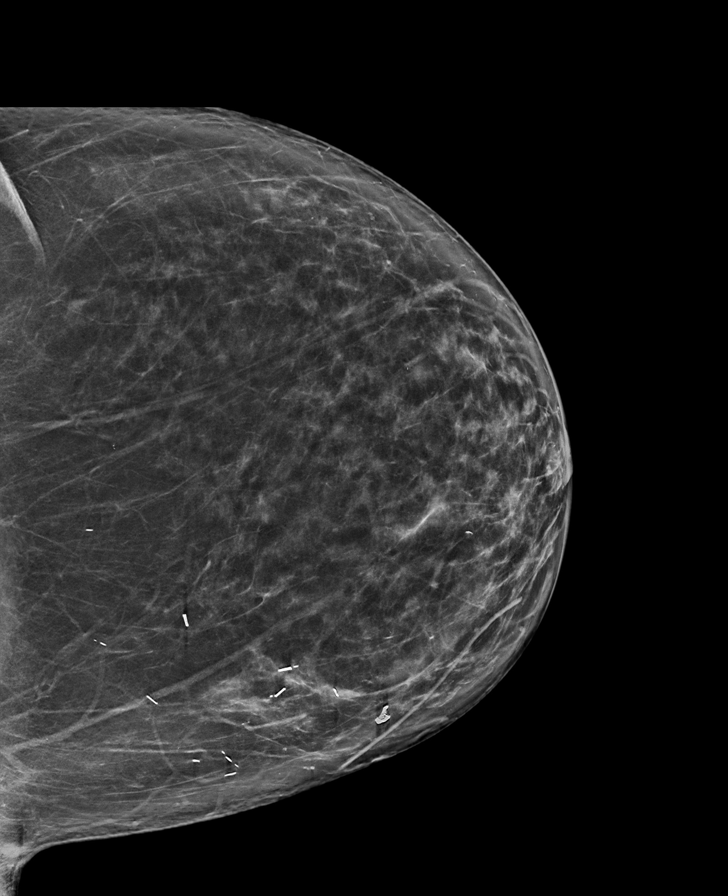

[R CC synth-2D]
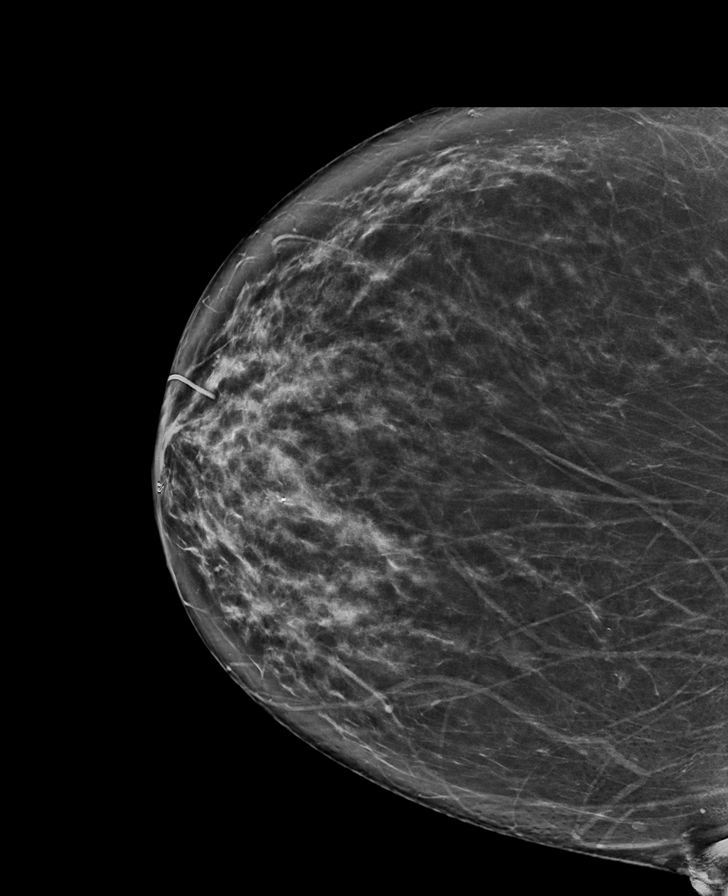

[L CC tomo · tomo slice 45/90.0]
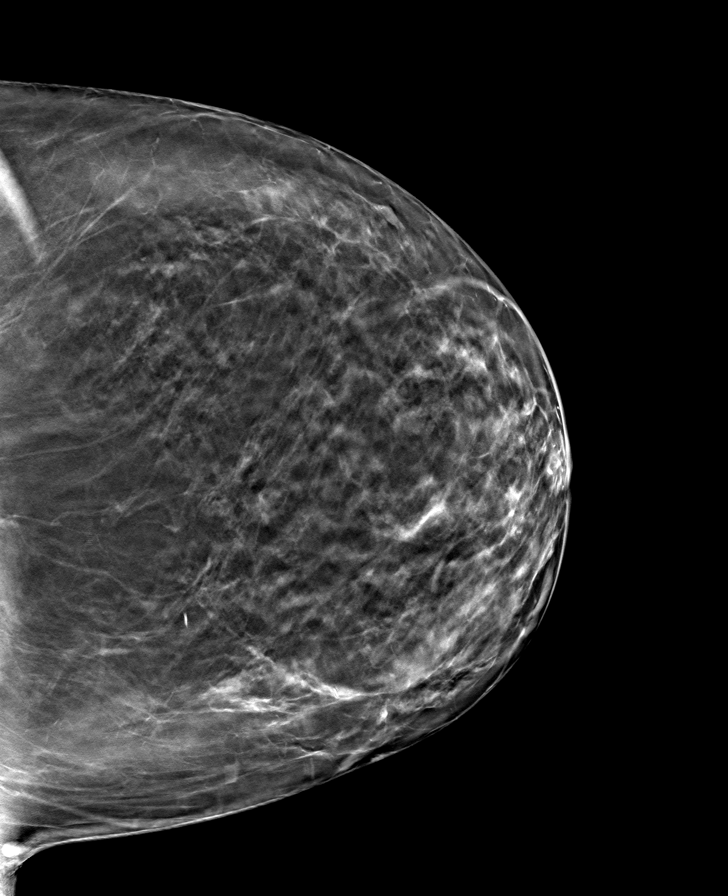

[R CC tomo · tomo slice 46/91.0]
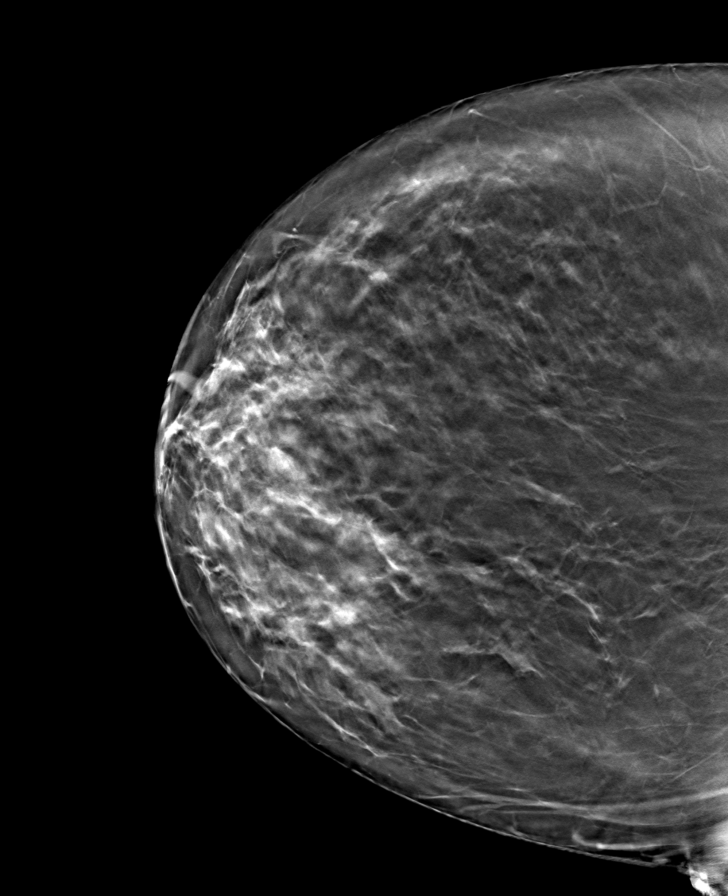

[R MLO tomo · tomo slice 51/100.0]
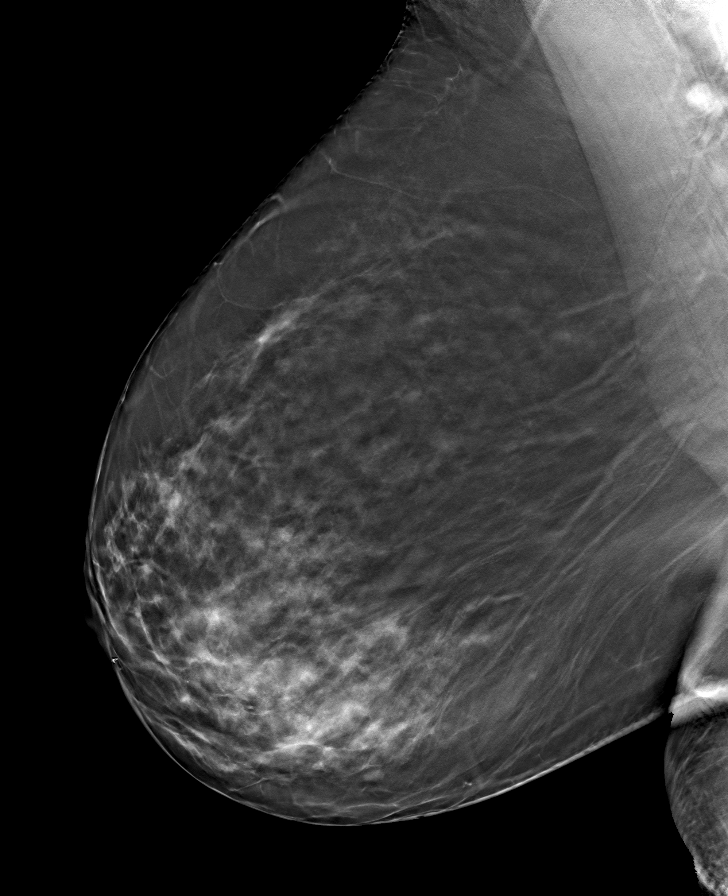

[L MLO tomo · tomo slice 51/100.0]
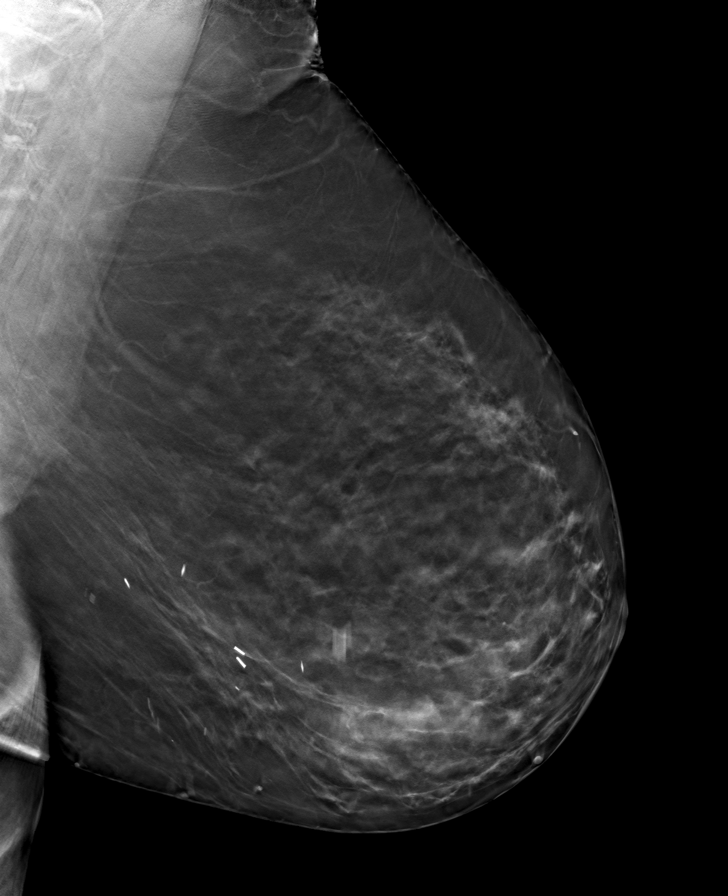

[8 of 24 positions shown; findings below may reference images not displayed]

ACR Breast Density Category b: There are scattered areas of
fibroglandular density.
FINDINGS: There are no findings suspicious for malignancy.
IMPRESSION: No mammographic evidence of malignancy. A result letter of this
screening mammogram will be mailed directly to the patient.

RECOMMENDATION:
Screening mammogram in one year. (Code:51-[id number])

BI-RADS CATEGORY  1: Negative.

## 2023-11-07 ENCOUNTER — Ambulatory Visit (HOSPITAL_BASED_OUTPATIENT_CLINIC_OR_DEPARTMENT_OTHER): Attending: Physician Assistant | Admitting: Physical Therapy

## 2023-11-07 ENCOUNTER — Other Ambulatory Visit: Payer: Self-pay | Admitting: Family Medicine

## 2023-11-07 ENCOUNTER — Other Ambulatory Visit: Payer: Self-pay

## 2023-11-07 ENCOUNTER — Encounter (HOSPITAL_BASED_OUTPATIENT_CLINIC_OR_DEPARTMENT_OTHER): Payer: Self-pay | Admitting: Physical Therapy

## 2023-11-07 DIAGNOSIS — Z1231 Encounter for screening mammogram for malignant neoplasm of breast: Secondary | ICD-10-CM

## 2023-11-07 DIAGNOSIS — M545 Low back pain, unspecified: Secondary | ICD-10-CM | POA: Diagnosis present

## 2023-11-07 DIAGNOSIS — R262 Difficulty in walking, not elsewhere classified: Secondary | ICD-10-CM | POA: Diagnosis present

## 2023-11-07 NOTE — Therapy (Signed)
 OUTPATIENT PHYSICAL THERAPY THORACOLUMBAR EVALUATION   Patient Name: Sara [name] MRN: [id number] DOB:December 27, 1955, 68 y.o., female Today's Date: 11/07/2023  END OF SESSION:  PT End of Session - 11/07/23 1719     Visit Number 1    Number of Visits 10    Date for PT Re-Evaluation 02/05/24    Authorization Type [employer]    PT Start Time 1633    PT Stop Time 1713    PT Time Calculation (min) 40 min    Activity Tolerance Patient tolerated treatment well    Behavior During Therapy [facility] for tasks assessed/performed             Past Medical History:  Diagnosis Date   Breast cancer (HCC)    Hypertension    Personal history of radiation therapy    Past Surgical History:  Procedure Laterality Date   BREAST EXCISIONAL BIOPSY Right 05/31/2010   BREAST LUMPECTOMY Left 05/31/2010   Left Dr Linell Rhymes   Patient Active Problem List   Diagnosis Date Noted   Breast cancer of upper-inner quadrant of left female breast (HCC) 02/18/2011    PCP:    Victorio Grave, MD    REFERRING PROVIDER: Ilsa Maltese, PA   REFERRING DIAG:  Diagnosis  M54.50 (ICD-10-CM) - Low back pain, unspecified    Rationale for Evaluation and Treatment: Rehabilitation  THERAPY DIAG:  Pain, lumbar region - Plan: PT plan of care cert/re-cert  Difficulty walking - Plan: PT plan of care cert/re-cert  ONSET DATE: 08/2023  SUBJECTIVE:                                                                                                                                                                                           SUBJECTIVE STATEMENT: Pt states that in March she was folding a table and felt her back "lock up." Denies radiating pain or sudden onset weakness. Felt like tight muscles and hard to straighten. Pt has history of this but will go away. This time is is lingering. Pain is better but stiff at the end of the day. Bending over and picking something us  is the usual injuries. She usually uses heat and  ice. Sometimes has lateral hip pain with SIJ pain. L> R in terms of hip pain  Pt is mainly walking for exercise. Largely happens at work. Walks about a mile 2-3x/wk. Has done some stretching but no strengthening exercise. Denies cancer red flags.     PERTINENT HISTORY:  HTN, breast cancer  PAIN:  Are you having pain? Yes: NPRS scale: 3/10 Pain location: low back Pain description: sharp, achy Aggravating factors: bending over a  lot, sitting for a long time  Relieving factors: ibuprofen, NSAID, heat/ice   PRECAUTIONS: None  RED FLAGS: None   WEIGHT BEARING RESTRICTIONS: No  FALLS:  Has patient fallen in last 6 months? 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 OBJECTIVE:  Note: Objective measures were completed at Evaluation unless otherwise noted.   DIAGNOSTIC FINDINGS:   N/A in medical record   PATIENT SURVEYS:  Oswestry Score: 20 / 50 or 40 % Graphical Oswestry Score: (%)  COGNITION: Overall cognitive status: Within functional limits for tasks assessed                          SENSATION: WFL   MUSCLE LENGTH: Hamstrings: positive supine 90/90    POSTURE: R SB with increased kyphosis   PALPATION: TTP to L glutes and hip; hypertonicity of lumbar paraspinals and QL on both sides with R >L   LUMBAR ROM:    AROM Eval  tigtness through all motions, mild discomfort  Flexion 50%   Extension 25%  Right lateral flexion 75%  Left lateral flexion 75%  Right rotation 50%  Left rotation 50%   (Blank rows = not tested)   LOWER EXTREMITY ROM:    WFL for tasks assessed, moderate hip rotation limitation  bilaterally   LOWER EXTREMITY MMT:  4+/5 throughout both hips without pain  LUMBAR SPECIAL TESTS:  Slump test: negative , SLR: negative SI Compression/distraction test: negative, and FABER test: negative    GAIT: Distance walked: 13ft Assistive device utilized: None Level of assistance: Complete Independence Comments: abnormal posture as listed above; decreased step length bilat   TODAY'S TREATMENT:                                                                                                                               DATE:   5/20  Exercises - Standing Sidebends  - 1 x daily - 4-5 x weekly - 2 sets - 10 reps - Lateral Shift Correction at Wall  - 1 x daily - 4-5 x weekly - 2 sets - 10 reps - Standing Forward Trunk Flexion  - 1 x daily - 4-5 x weekly - 2 sets - 10 reps - Standing Anti-Rotation Press with Anchored Resistance  - 1 x daily - 4-5 x weekly - 2 sets - 10 reps      PATIENT EDUCATION:  Education details:  MOI, diagnosis, prognosis, anatomy, exercise progression, DOMS expectations, muscle firing,  envelope of function, HEP, POC  Person educated: Patient Education method: Explanation, Demonstration, Tactile cues, Verbal cues,  Education comprehension: verbalized understanding, returned demonstration,  and verbal cues required    HOME EXERCISE PROGRAM:    Access Code: [email or web address] URL: [email or web address] Date: 11/07/2023 Prepared by: Silver Dross               ASSESSMENT:   CLINICAL IMPRESSION:   Patient is a 68 y.o. female who was seen today for physical therapy evaluation and treatment for c/c of LBP. Pt's s/s appear consistent with mechanical LBP and L SIJ sensitivity. Pt's pain is moderately sensitive and irritable with movement. Pt's is more stiffness dominant at this time and may respond well to initial joint mobilizations. Plan to continue with return to exercise progression and return activity training at future sessions. Pt would benefit from  continued skilled therapy in order to reach goals and maximize functional thoracolumbar strength and ROM for full return to PLOF.      OBJECTIVE IMPAIRMENTS Abnormal gait, decreased activity tolerance, decreased endurance, decreased mobility, difficulty walking, decreased ROM, hypomobility, increased muscle spasms, impaired flexibility, improper body mechanics, postural dysfunction, and pain.    ACTIVITY LIMITATIONS carrying, lifting, bending, sitting, standing, squatting, sleeping, stairs, transfers, and locomotion level   PARTICIPATION LIMITATIONS: community activity, occupation, and exercise   PERSONAL FACTORS Time since onset of injury/illness/exacerbation and 1-2 comorbidities are also affecting patient's functional outcome.       GOALS:     SHORT TERM GOALS: Target date: 12/19/2023          Pt will become independent with HEP in order to demonstrate synthesis of PT education.     Goal status: INITIAL     LONG TERM GOALS: Target date: 02/05/2024          Pt  will become independent with final HEP in order to demonstrate synthesis of PT education.    Goal status: INITIAL   2.  Pt will be able to demonstrate deadlift/squat with >/= 20 lbs in order to demonstrate functional improvement in lumbopelic stability and strength for return to PLOF and occupation.  Goal status: INITIAL   3.  Pt will be able to demonstrate/report ability to sit/stand/sleep for extended periods of time without pain in order to demonstrate functional improvement and tolerance to static positioning.    Goal status: INITIAL   4. Pt will be able to demonstrate/report ability to walk >25 mins without pain in order to demonstrate functional improvement and tolerance to exercise and community mobility.    Goal status: INITIAL     PLAN:   PLAN: PT FREQUENCY: 1x/week   PT DURATION: 12 weeks    PLANNED INTERVENTIONS: Therapeutic exercises, Therapeutic activity, Neuromuscular re-education, Balance  training, Gait training, Patient/Family education, Joint manipulation, Joint mobilization, Stair training, Orthotic/Fit training, DME instructions, Aquatic Therapy, Dry Needling, Electrical stimulation, Spinal manipulation, Spinal mobilization, Cryotherapy, Moist heat, scar mobilization, Splintting, Taping, Vasopneumatic device, Traction, Ultrasound, Ionotophoresis 4mg /ml Dexamethasone, Manual therapy, and Re-evaluation.   PLAN FOR NEXT SESSION: mobilizations; weight room progression, lumbar mobility    Silver Dross, PT 11/07/2023, 5:21 PM

## 2023-12-06 ENCOUNTER — Ambulatory Visit
Admission: RE | Admit: 2023-12-06 | Discharge: 2023-12-06 | Disposition: A | Discharge status: Home / Self Care | Source: Ambulatory Visit | Attending: Family Medicine | Admitting: Family Medicine

## 2023-12-06 DIAGNOSIS — Z1231 Encounter for screening mammogram for malignant neoplasm of breast: Secondary | ICD-10-CM

## 2023-12-07 ENCOUNTER — Encounter (HOSPITAL_BASED_OUTPATIENT_CLINIC_OR_DEPARTMENT_OTHER): Payer: Self-pay

## 2023-12-07 ENCOUNTER — Ambulatory Visit (HOSPITAL_BASED_OUTPATIENT_CLINIC_OR_DEPARTMENT_OTHER): Attending: Physician Assistant

## 2023-12-07 DIAGNOSIS — R262 Difficulty in walking, not elsewhere classified: Secondary | ICD-10-CM | POA: Diagnosis present

## 2023-12-07 DIAGNOSIS — M545 Low back pain, unspecified: Secondary | ICD-10-CM | POA: Diagnosis present

## 2023-12-07 NOTE — Therapy (Signed)
 OUTPATIENT PHYSICAL THERAPY THORACOLUMBAR TREATMENT   Patient Name: Sara [name] MRN: [id number] DOB:05/12/1956, 68 y.o., female Today's Date: 12/07/2023  END OF SESSION:  PT End of Session - 12/07/23 0755     Visit Number 2    Number of Visits 10    Date for PT Re-Evaluation 02/05/24    Authorization Type [employer]    PT Start Time 0801    PT Stop Time 0845    PT Time Calculation (min) 44 min    Activity Tolerance Patient tolerated treatment well    Behavior During Therapy [facility] for tasks assessed/performed           Past Medical History:  Diagnosis Date   Breast cancer (HCC)    Hypertension    Personal history of radiation therapy    Past Surgical History:  Procedure Laterality Date   BREAST EXCISIONAL BIOPSY Right 05/31/2010   BREAST LUMPECTOMY Left 05/31/2010   Left Dr Linell Rhymes   Patient Active Problem List   Diagnosis Date Noted   Breast cancer of upper-inner quadrant of left female breast (HCC) 02/18/2011    PCP:    Victorio Grave, MD    REFERRING PROVIDER: Ilsa Maltese, PA   REFERRING DIAG:  Diagnosis  M54.50 (ICD-10-CM) - Low back pain, unspecified    Rationale for Evaluation and Treatment: Rehabilitation  THERAPY DIAG:  Difficulty walking  Pain, lumbar region  ONSET DATE: 08/2023  SUBJECTIVE:                                                                                                                                                                                           SUBJECTIVE STATEMENT: Pt reports no pain at rest, but does have pain with certain movements. Feels the pain more on L side. Compliant with HEP. Mild improvement in standing tolerance compared to sitting tolerance. Able to sleep without pain.    Eval:  Pt states that in March she was folding a table and felt her back lock up. Denies radiating pain or sudden onset weakness. Felt like tight muscles and hard to straighten. Pt has history of this but will go away. This  time is is lingering. Pain is better but stiff at the end of the day. Bending over and picking something us  is the usual injuries. She usually uses heat and ice. Sometimes has lateral hip pain with SIJ pain. L> R in terms of hip pain  Pt is mainly walking for exercise. Largely happens at work. Walks about a mile 2-3x/wk. Has done some stretching but no strengthening exercise. Denies cancer red flags.     PERTINENT HISTORY:  HTN,  breast cancer  PAIN:  Are you having pain? Yes: NPRS scale: 3/10 Pain location: low back Pain description: sharp, achy Aggravating factors: bending over a lot, sitting for a long time  Relieving factors: ibuprofen, NSAID, heat/ice   PRECAUTIONS: None  RED FLAGS: None   WEIGHT BEARING RESTRICTIONS: No  FALLS:  Has patient fallen in last 6 months? 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 OBJECTIVE:  Note: Objective measures were completed at Evaluation unless otherwise noted.   DIAGNOSTIC FINDINGS:   N/A in medical record   PATIENT SURVEYS:  Oswestry Score: 20 / 50 or 40 % Graphical Oswestry Score: (%)  COGNITION: Overall cognitive status: Within functional limits for tasks assessed                          SENSATION: WFL   MUSCLE LENGTH: Hamstrings: positive supine 90/90    POSTURE: R SB with increased kyphosis   PALPATION: TTP to L glutes and hip; hypertonicity of lumbar paraspinals and QL on both sides with R >L   LUMBAR ROM:    AROM Eval  tigtness through all motions, mild discomfort  Flexion 50%   Extension 25%  Right lateral flexion 75%  Left lateral flexion 75%  Right  rotation 50%  Left rotation 50%   (Blank rows = not tested)   LOWER EXTREMITY ROM:    WFL for tasks assessed, moderate hip rotation limitation bilaterally   LOWER EXTREMITY MMT:  4+/5 throughout both hips without pain  LUMBAR SPECIAL TESTS:  Slump test: negative , SLR: negative SI Compression/distraction test: negative, and FABER test: negative    GAIT: Distance walked: 64ft Assistive device utilized: None Level of assistance: Complete Independence Comments: abnormal posture as listed above; decreased step length bilat   TODAY'S TREATMENT:                                                                                                                               DATE:    6/19  DKTC 3x 30sec Piriformis stretch 30sec x2ea Child's pose  S/l clam 2x10ea Prone hip extension 2x10ea Supine SLR x10ea Supine PPT 5 2x10 Supine march 2x10 Standing PPT at wall 5 x10 Gait in hall Seated fwd lumbar stretch 15 x4 Thomas stretch 2x30sec ea      5/20  Exercises - Standing Sidebends  - 1 x daily - 4-5 x weekly - 2 sets - 10 reps - Lateral Shift Correction at Wall  - 1 x daily - 4-5 x weekly - 2 sets - 10 reps - Standing Forward Trunk Flexion  - 1  x daily - 4-5 x weekly - 2 sets - 10 reps - Standing Anti-Rotation Press with Anchored Resistance  - 1 x daily - 4-5 x weekly - 2 sets - 10 reps      PATIENT EDUCATION:  Education details:  MOI, diagnosis, prognosis, anatomy, exercise progression, DOMS expectations, muscle firing,  envelope of function, HEP, POC  Person educated: Patient Education method: Explanation, Demonstration, Tactile cues, Verbal cues,  Education comprehension: verbalized understanding, returned demonstration, and verbal cues required    HOME EXERCISE PROGRAM:    Access Code: [email or web address] URL: [email or web address] Date: 11/07/2023 Prepared by: Silver Dross               ASSESSMENT:   CLINICAL IMPRESSION:  Pt with good tolerance for  core/lumbar stabilization and strengthening program. She denied pain with exercises. Felt most fatigue with s/l clam. Demonstrates flexed trunk posture with normal gait, so added thomas stretch to address hip flexor tightness. Provided updated HEP to include progressions.   Eval: Patient is a 68 y.o. female who was seen today for physical therapy evaluation and treatment for c/c of LBP. Pt's s/s appear consistent with mechanical LBP and L SIJ sensitivity. Pt's pain is moderately sensitive and irritable with movement. Pt's is more stiffness dominant at this time and may respond well to initial joint mobilizations. Plan to continue with return to exercise progression and return activity training at future sessions. Pt would benefit from continued skilled therapy in order to reach goals and maximize functional thoracolumbar strength and ROM for full return to PLOF.      OBJECTIVE IMPAIRMENTS Abnormal gait, decreased activity tolerance, decreased endurance, decreased mobility, difficulty walking, decreased ROM, hypomobility, increased muscle spasms, impaired flexibility, improper body mechanics, postural dysfunction, and pain.    ACTIVITY LIMITATIONS carrying, lifting, bending, sitting, standing, squatting, sleeping, stairs, transfers, and locomotion level   PARTICIPATION LIMITATIONS: community activity, occupation, and exercise   PERSONAL FACTORS Time since onset of injury/illness/exacerbation and 1-2 comorbidities are also affecting patient's functional outcome.       GOALS:     SHORT TERM GOALS: Target date: 12/19/2023          Pt will become independent with HEP in order to demonstrate synthesis of PT education.     Goal status: INITIAL     LONG TERM GOALS: Target date: 02/05/2024          Pt  will become independent with final HEP in order to demonstrate synthesis of PT education.    Goal status: IN PROGRESS 6/19   2.  Pt will be able to demonstrate deadlift/squat with >/= 20 lbs  in order to demonstrate functional improvement in lumbopelic stability and strength for return to PLOF and occupation.  Goal status: INITIAL   3.  Pt will be able to demonstrate/report ability to sit/stand/sleep for extended periods of time without pain in order to demonstrate functional improvement and tolerance to static positioning.    Goal status: INITIAL   4. Pt will be able to demonstrate/report ability to walk >25 mins without pain in order to demonstrate functional improvement and tolerance to exercise and community mobility.    Goal status: INITIAL     PLAN:   PLAN: PT FREQUENCY: 1x/week   PT DURATION: 12 weeks    PLANNED INTERVENTIONS: Therapeutic exercises, Therapeutic activity, Neuromuscular re-education, Balance training, Gait training, Patient/Family education, Joint manipulation, Joint mobilization, Stair training, Orthotic/Fit training, DME instructions, Aquatic Therapy, Dry Needling, Electrical stimulation, Spinal manipulation, Spinal mobilization, Cryotherapy, Moist heat, scar  mobilization, Splintting, Taping, Vasopneumatic device, Traction, Ultrasound, Ionotophoresis 4mg /ml Dexamethasone, Manual therapy, and Re-evaluation.   PLAN FOR NEXT SESSION: mobilizations; weight room progression, lumbar mobility    Fronie Jewett Llewellyn Schoenberger, PTA 12/07/2023, 10:02 AM

## 2023-12-08 ENCOUNTER — Encounter: Payer: Self-pay | Admitting: Family Medicine

## 2023-12-08 ENCOUNTER — Other Ambulatory Visit: Payer: Self-pay | Admitting: Family Medicine

## 2023-12-08 DIAGNOSIS — N63 Unspecified lump in unspecified breast: Secondary | ICD-10-CM

## 2023-12-14 ENCOUNTER — Ambulatory Visit
Admission: RE | Admit: 2023-12-14 | Discharge: 2023-12-14 | Disposition: A | Discharge status: Home / Self Care | Source: Ambulatory Visit | Attending: Family Medicine | Admitting: Family Medicine

## 2023-12-14 DIAGNOSIS — N63 Unspecified lump in unspecified breast: Secondary | ICD-10-CM

## 2023-12-25 ENCOUNTER — Encounter (HOSPITAL_BASED_OUTPATIENT_CLINIC_OR_DEPARTMENT_OTHER): Admitting: Physical Therapy

## 2024-01-08 ENCOUNTER — Encounter (HOSPITAL_BASED_OUTPATIENT_CLINIC_OR_DEPARTMENT_OTHER)

## 2024-01-22 ENCOUNTER — Encounter (HOSPITAL_BASED_OUTPATIENT_CLINIC_OR_DEPARTMENT_OTHER): Admitting: Physical Therapy
